# Patient Record
Sex: Male | Born: 1955 | Race: White | Hispanic: No | Marital: Married | Smoking: Never smoker
Health system: Southern US, Community
[De-identification: ages and names within clinical notes are randomized; demographics above are authoritative.]

## PROBLEM LIST (undated history)

## (undated) DIAGNOSIS — I10 Essential (primary) hypertension: Secondary | ICD-10-CM

## (undated) DIAGNOSIS — F32A Depression, unspecified: Secondary | ICD-10-CM

## (undated) DIAGNOSIS — J189 Pneumonia, unspecified organism: Secondary | ICD-10-CM

## (undated) DIAGNOSIS — M1990 Unspecified osteoarthritis, unspecified site: Secondary | ICD-10-CM

## (undated) DIAGNOSIS — F419 Anxiety disorder, unspecified: Secondary | ICD-10-CM

## (undated) DIAGNOSIS — N189 Chronic kidney disease, unspecified: Secondary | ICD-10-CM

## (undated) DIAGNOSIS — F329 Major depressive disorder, single episode, unspecified: Secondary | ICD-10-CM

## (undated) DIAGNOSIS — F99 Mental disorder, not otherwise specified: Secondary | ICD-10-CM

## (undated) HISTORY — PX: KNEE ARTHROSCOPY: SUR90

## (undated) HISTORY — PX: WRIST ARTHROSCOPY: SUR100

## (undated) HISTORY — PX: JOINT REPLACEMENT: SHX530

## (undated) HISTORY — PX: OTHER SURGICAL HISTORY: SHX169

## (undated) HISTORY — PX: SHOULDER ARTHROSCOPY DISTAL CLAVICLE EXCISION AND OPEN ROTATOR CUFF REPAIR: SHX2396

---

## 2005-07-07 ENCOUNTER — Ambulatory Visit: Payer: Self-pay | Admitting: Internal Medicine

## 2005-08-01 ENCOUNTER — Emergency Department: Payer: Self-pay | Admitting: Emergency Medicine

## 2005-08-01 ENCOUNTER — Other Ambulatory Visit: Payer: Self-pay

## 2007-07-04 ENCOUNTER — Ambulatory Visit: Payer: Self-pay | Admitting: General Practice

## 2008-09-22 ENCOUNTER — Emergency Department: Payer: Self-pay | Admitting: Emergency Medicine

## 2010-08-15 ENCOUNTER — Ambulatory Visit: Payer: Self-pay | Admitting: Unknown Physician Specialty

## 2011-01-13 ENCOUNTER — Ambulatory Visit: Payer: Self-pay | Admitting: Anesthesiology

## 2011-01-18 ENCOUNTER — Ambulatory Visit: Payer: Self-pay | Admitting: Unknown Physician Specialty

## 2011-06-29 ENCOUNTER — Other Ambulatory Visit: Payer: Self-pay | Admitting: Urology

## 2011-07-05 ENCOUNTER — Encounter (HOSPITAL_COMMUNITY): Payer: Self-pay | Admitting: *Deleted

## 2011-07-05 NOTE — Progress Notes (Signed)
Patient reminded not to take any Aspirin products or vitamins 3 days prior to ESWL. He said he has stopped taking all of his medicationa

## 2011-07-07 ENCOUNTER — Encounter (HOSPITAL_COMMUNITY): Payer: Self-pay | Admitting: Pharmacy Technician

## 2011-07-07 NOTE — H&P (Signed)
History of Present Illness  Steven Madden is a 56 year old with the following urologic history:  1) Hematuria: He developed painless gross hematuria in May 2013. He has no family history of bladder or kidney cancer. He did smoke 1 PPD for 2 years but quit around 1980. He underwent an evaluation including cystoscopy and CT imaging in May 2013 which indicated a ureteral stone and no findings to suggest concern for malignancy.  2) Urolithiasis: He has a history of recurrent urolithiasis.  2011: Passed a stone  3) Prostate cancer screening: He has a family history of prostate cancer.  His father was diagnosed at age 47. He receives PSA screening by Dr. Hyacinth Meeker.  Interval history:  He follows up today for further evaluation is 8 mm proximal right ureteral calculus. He has not noted that he passed any stone fragments as his last visit. He did have gross hematuria and mild right-sided flank pain over the weekend although has not had uncontrolled pain, fever, or associated nausea or vomiting. He follows up today with a followup KUB x-ray.   Past Medical History Problems  1. History of  Anxiety (Symptom) 300.00 2. History of  Arthritis V13.4 3. History of  Hypercholesterolemia 272.0  Surgical History Problems  1. History of  Arthroscopy Knee Left 2. History of  Arthroscopy Knee Right 3. History of  Shoulder Surgery Right 4. History of  Wrist Surgery Right  Current Meds 1. Lovastatin 10 MG Oral Tablet; Therapy: (Recorded:17May2013) to 2. Tamsulosin HCl 0.4 MG Oral Capsule; TAKE 1 CAPSULE Daily; Therapy: 21May2013 to  (Evaluate:04Jun2013)  Requested for: 21May2013; Last Rx:21May2013 3. Xanax 0.5 MG Oral Tablet; Therapy: (Recorded:17May2013) to 4. Zoloft 50 MG Oral Tablet; Therapy: (Recorded:17May2013) to  Allergies Medication  1. Penicillins Non-Medication  2. Latex  Family History Problems  1. Family history of  Acute Myocardial Infarction V17.3 2. Family history of  Hypertension  V17.49 3. Family history of  Malignant Melanoma Of The Skin V16.8 4. Fraternal history of  Nephrolithiasis 5. Paternal history of  Prostate Cancer V16.42  Social History Problems  1. Former Smoker V15.82 1 PPD for 2 years and quit in 1980 2. Marital History - Currently Married Denied  3. History of  Alcohol Use  Vitals Vital Signs [Data Includes: Last 1 Day]  03Jun2013 03:25PM  BMI Calculated: 29.44 BSA Calculated: 2.19 Height: 5 ft 11.5 in Weight: 215 lb  Blood Pressure: 161 / 91 Heart Rate: 85  Physical Exam Constitutional: Well nourished and well developed . No acute distress.  ENT:. The ears and nose are normal in appearance.  Neck: The appearance of the neck is normal and no neck mass is present.  Pulmonary: No respiratory distress and normal respiratory rhythm and effort.  Cardiovascular: Heart rate and rhythm are normal . No peripheral edema.  Abdomen: No CVA tenderness.    Results/Data Urine [Data Includes: Last 1 Day]   03Jun2013  COLOR YELLOW   APPEARANCE CLEAR   SPECIFIC GRAVITY >1.030   pH 6.0   GLUCOSE NEG mg/dL  BILIRUBIN NEG   KETONE NEG mg/dL  BLOOD LARGE   PROTEIN NEG mg/dL  UROBILINOGEN 0.2 mg/dL  NITRITE NEG   LEUKOCYTE ESTERASE NEG   SQUAMOUS EPITHELIAL/HPF RARE   WBC 4-6 WBC/hpf  RBC 7-10 RBC/hpf  BACTERIA NONE SEEN   CRYSTALS NONE SEEN   CASTS NONE SEEN     I independently reviewed his KUB. This demonstrates a stable calcification in the vicinity of the right proximal ureter consistent with  his known stone. It appears unchanged from his prior KUB x-ray.   Assessment Assessed  1. Ureteral Stone 592.1  Plan Health Maintenance (V70.0)  1. UA With REFLEX  Done: 03Jun2013 02:50PM  Discussion/Summary  1. Right proximal ureteral calculus. We reviewed treatment options for his which would include shock wave lithotripsy or ureteroscopic laser lithotripsy. We also discussed the alternative option of a percutaneous nephrolithotomy which I did  not recommend. After reviewing the potential pros and cons of each approach, he would like to proceed with shockwave lithotripsy. We have reviewed the potential risks and complications as well as the expected recovery process in detail. He also understands the possibility of bleeding, infection, damage/loss of kidney, or failure of the procedure and possible need for further procedures, et Karie Soda. This will be scheduled for the near future.  2. Recurrent urolithiasis: I will recommend he proceed with a metabolic evaluation following treatment of his acute stone.  Cc: Dr. Bethann Punches     Signatures Electronically signed by : Heloise Purpura, M.D.; Jun 28 2011  4:05PM

## 2011-07-08 ENCOUNTER — Ambulatory Visit (HOSPITAL_COMMUNITY): Payer: BC Managed Care – PPO

## 2011-07-08 ENCOUNTER — Ambulatory Visit (HOSPITAL_COMMUNITY)
Admission: RE | Admit: 2011-07-08 | Discharge: 2011-07-08 | Disposition: A | Discharge status: Home / Self Care | Payer: BC Managed Care – PPO | Source: Ambulatory Visit | Attending: Urology | Admitting: Urology

## 2011-07-08 ENCOUNTER — Encounter (HOSPITAL_COMMUNITY): Payer: Self-pay | Admitting: *Deleted

## 2011-07-08 ENCOUNTER — Encounter (HOSPITAL_COMMUNITY)
Admission: RE | Disposition: A | Discharge status: Home / Self Care | Payer: Self-pay | Source: Ambulatory Visit | Attending: Urology

## 2011-07-08 DIAGNOSIS — Z79899 Other long term (current) drug therapy: Secondary | ICD-10-CM | POA: Insufficient documentation

## 2011-07-08 DIAGNOSIS — N201 Calculus of ureter: Secondary | ICD-10-CM | POA: Insufficient documentation

## 2011-07-08 DIAGNOSIS — E7800 Pure hypercholesterolemia, unspecified: Secondary | ICD-10-CM | POA: Insufficient documentation

## 2011-07-08 DIAGNOSIS — M542 Cervicalgia: Secondary | ICD-10-CM | POA: Insufficient documentation

## 2011-07-08 DIAGNOSIS — F411 Generalized anxiety disorder: Secondary | ICD-10-CM | POA: Insufficient documentation

## 2011-07-08 HISTORY — DX: Pneumonia, unspecified organism: J18.9

## 2011-07-08 HISTORY — DX: Anxiety disorder, unspecified: F41.9

## 2011-07-08 HISTORY — DX: Chronic kidney disease, unspecified: N18.9

## 2011-07-08 HISTORY — DX: Unspecified osteoarthritis, unspecified site: M19.90

## 2011-07-08 HISTORY — DX: Mental disorder, not otherwise specified: F99

## 2011-07-08 SURGERY — LITHOTRIPSY, ESWL
Anesthesia: LOCAL | Laterality: Right

## 2011-07-08 MED ORDER — DIAZEPAM 5 MG PO TABS
10.0000 mg | ORAL_TABLET | ORAL | Status: AC
  Administered 2011-07-08: 10 mg via ORAL

## 2011-07-08 MED ORDER — DIAZEPAM 5 MG PO TABS
ORAL_TABLET | ORAL | Status: AC
  Filled 2011-07-08: qty 2

## 2011-07-08 MED ORDER — DIPHENHYDRAMINE HCL 25 MG PO CAPS
25.0000 mg | ORAL_CAPSULE | ORAL | Status: AC
  Administered 2011-07-08: 25 mg via ORAL

## 2011-07-08 MED ORDER — CIPROFLOXACIN HCL 500 MG PO TABS: 500.0000 mg | ORAL_TABLET | ORAL | Status: DC

## 2011-07-08 MED ORDER — DEXTROSE-NACL 5-0.45 % IV SOLN
INTRAVENOUS | Status: DC
  Administered 2011-07-08: 15:00:00 via INTRAVENOUS

## 2011-07-08 MED ORDER — CIPROFLOXACIN HCL 500 MG PO TABS
ORAL_TABLET | ORAL | Status: AC
  Administered 2011-07-08: 500 mg
  Filled 2011-07-08: qty 1

## 2011-07-08 MED ORDER — DIPHENHYDRAMINE HCL 25 MG PO CAPS
ORAL_CAPSULE | ORAL | Status: AC
  Filled 2011-07-08: qty 1

## 2011-07-08 NOTE — Op Note (Signed)
See op note in paper chart.

## 2011-07-08 NOTE — Progress Notes (Signed)
Patient and wife given discharge instructions, teach back method

## 2011-07-08 NOTE — Discharge Instructions (Signed)
Call if you develop fever > 101, uncontrolled pain, or persistent nausea or vomiting.  Strain urine as instructed and bring stone fragments to your followup appointment.  Take pain medication as needed for pain.  You can expect to have pain in your abdomen or back to some degree for a few days and sometimes longer.  However, if pain is severe and not controlled with your pain medication, you should call your doctor.

## 2011-07-20 ENCOUNTER — Other Ambulatory Visit: Payer: Self-pay | Admitting: Urology

## 2011-07-20 ENCOUNTER — Encounter (HOSPITAL_BASED_OUTPATIENT_CLINIC_OR_DEPARTMENT_OTHER): Payer: Self-pay | Admitting: *Deleted

## 2011-07-20 ENCOUNTER — Ambulatory Visit (HOSPITAL_BASED_OUTPATIENT_CLINIC_OR_DEPARTMENT_OTHER)
Admission: RE | Admit: 2011-07-20 | Discharge: 2011-07-20 | Disposition: A | Discharge status: Home / Self Care | Payer: BC Managed Care – PPO | Source: Ambulatory Visit | Attending: Urology | Admitting: Urology

## 2011-07-20 ENCOUNTER — Encounter (HOSPITAL_BASED_OUTPATIENT_CLINIC_OR_DEPARTMENT_OTHER): Payer: Self-pay | Admitting: Anesthesiology

## 2011-07-20 ENCOUNTER — Encounter (HOSPITAL_BASED_OUTPATIENT_CLINIC_OR_DEPARTMENT_OTHER)
Admission: RE | Disposition: A | Discharge status: Home / Self Care | Payer: Self-pay | Source: Ambulatory Visit | Attending: Urology

## 2011-07-20 ENCOUNTER — Ambulatory Visit (HOSPITAL_BASED_OUTPATIENT_CLINIC_OR_DEPARTMENT_OTHER): Payer: BC Managed Care – PPO | Admitting: Anesthesiology

## 2011-07-20 DIAGNOSIS — N1330 Unspecified hydronephrosis: Secondary | ICD-10-CM | POA: Insufficient documentation

## 2011-07-20 DIAGNOSIS — N201 Calculus of ureter: Secondary | ICD-10-CM | POA: Insufficient documentation

## 2011-07-20 DIAGNOSIS — E7800 Pure hypercholesterolemia, unspecified: Secondary | ICD-10-CM | POA: Insufficient documentation

## 2011-07-20 DIAGNOSIS — Z79899 Other long term (current) drug therapy: Secondary | ICD-10-CM | POA: Insufficient documentation

## 2011-07-20 HISTORY — PX: CYSTOSCOPY W/ URETERAL STENT PLACEMENT: SHX1429

## 2011-07-20 LAB — POCT I-STAT 4, (NA,K, GLUC, HGB,HCT)
Hemoglobin: 15.6 g/dL (ref 13.0–17.0)
Potassium: 3.9 mEq/L (ref 3.5–5.1)

## 2011-07-20 SURGERY — CYSTOSCOPY, WITH RETROGRADE PYELOGRAM AND URETERAL STENT INSERTION
Anesthesia: General | Site: Ureter | Laterality: Right | Wound class: Clean Contaminated

## 2011-07-20 MED ORDER — PROMETHAZINE HCL 25 MG/ML IJ SOLN: 6.2500 mg | INTRAMUSCULAR | Status: DC | PRN

## 2011-07-20 MED ORDER — FENTANYL CITRATE 0.05 MG/ML IJ SOLN: 25.0000 ug | INTRAMUSCULAR | Status: DC | PRN

## 2011-07-20 MED ORDER — URELLE 81 MG PO TABS: 1.0000 | ORAL_TABLET | Freq: Three times a day (TID) | ORAL | Status: DC

## 2011-07-20 MED ORDER — ACETAMINOPHEN 650 MG RE SUPP: 650.0000 mg | RECTAL | Status: DC | PRN

## 2011-07-20 MED ORDER — HYDROCODONE-ACETAMINOPHEN 5-325 MG PO TABS
1.0000 | ORAL_TABLET | Freq: Four times a day (QID) | ORAL | Status: AC | PRN
  Administered 2011-07-20: 1 via ORAL

## 2011-07-20 MED ORDER — GLYCOPYRROLATE 0.2 MG/ML IJ SOLN
INTRAMUSCULAR | Status: DC | PRN
  Administered 2011-07-20: 0.2 mg via INTRAVENOUS

## 2011-07-20 MED ORDER — SODIUM CHLORIDE 0.9 % IV SOLN: 250.0000 mL | INTRAVENOUS | Status: DC | PRN

## 2011-07-20 MED ORDER — ACETAMINOPHEN 325 MG PO TABS: 650.0000 mg | ORAL_TABLET | ORAL | Status: DC | PRN

## 2011-07-20 MED ORDER — IOHEXOL 350 MG/ML SOLN
INTRAVENOUS | Status: DC | PRN
  Administered 2011-07-20: 50 mL via INTRAVENOUS

## 2011-07-20 MED ORDER — ONDANSETRON HCL 4 MG/2ML IJ SOLN: 4.0000 mg | Freq: Four times a day (QID) | INTRAMUSCULAR | Status: DC | PRN

## 2011-07-20 MED ORDER — DEXAMETHASONE SODIUM PHOSPHATE 4 MG/ML IJ SOLN
INTRAMUSCULAR | Status: DC | PRN
  Administered 2011-07-20 (×2): 5 mg via INTRAVENOUS

## 2011-07-20 MED ORDER — KETOROLAC TROMETHAMINE 30 MG/ML IJ SOLN
INTRAMUSCULAR | Status: DC | PRN
  Administered 2011-07-20: 30 mg via INTRAVENOUS

## 2011-07-20 MED ORDER — LIDOCAINE HCL (CARDIAC) 20 MG/ML IV SOLN
INTRAVENOUS | Status: DC | PRN
  Administered 2011-07-20: 80 mg via INTRAVENOUS

## 2011-07-20 MED ORDER — SODIUM CHLORIDE 0.9 % IJ SOLN: 3.0000 mL | Freq: Two times a day (BID) | INTRAMUSCULAR | Status: DC

## 2011-07-20 MED ORDER — MEPERIDINE HCL 25 MG/ML IJ SOLN: 6.2500 mg | INTRAMUSCULAR | Status: DC | PRN

## 2011-07-20 MED ORDER — ACETAMINOPHEN 10 MG/ML IV SOLN
INTRAVENOUS | Status: DC | PRN
  Administered 2011-07-20: 1000 mg via INTRAVENOUS

## 2011-07-20 MED ORDER — OXYCODONE HCL 5 MG PO TABS: 5.0000 mg | ORAL_TABLET | ORAL | Status: DC | PRN

## 2011-07-20 MED ORDER — MIDAZOLAM HCL 5 MG/5ML IJ SOLN
INTRAMUSCULAR | Status: DC | PRN
  Administered 2011-07-20: 2 mg via INTRAVENOUS

## 2011-07-20 MED ORDER — CIPROFLOXACIN IN D5W 400 MG/200ML IV SOLN: 400.0000 mg | INTRAVENOUS | Status: DC

## 2011-07-20 MED ORDER — CIPROFLOXACIN IN D5W 400 MG/200ML IV SOLN
400.0000 mg | Freq: Two times a day (BID) | INTRAVENOUS | Status: DC
  Administered 2011-07-20: 400 mg via INTRAVENOUS

## 2011-07-20 MED ORDER — SODIUM CHLORIDE 0.9 % IJ SOLN: 3.0000 mL | INTRAMUSCULAR | Status: DC | PRN

## 2011-07-20 MED ORDER — INDIGOTINDISULFONATE SODIUM 8 MG/ML IJ SOLN
INTRAMUSCULAR | Status: DC | PRN
  Administered 2011-07-20: 5 mL via INTRAVENOUS

## 2011-07-20 MED ORDER — SODIUM CHLORIDE 0.9 % IR SOLN
Status: DC | PRN
  Administered 2011-07-20: 3000 mL

## 2011-07-20 MED ORDER — OXYBUTYNIN CHLORIDE ER 5 MG PO TB24: 5.0000 mg | ORAL_TABLET | Freq: Every day | ORAL | Status: DC

## 2011-07-20 MED ORDER — PROPOFOL 10 MG/ML IV EMUL
INTRAVENOUS | Status: DC | PRN
  Administered 2011-07-20: 250 mg via INTRAVENOUS

## 2011-07-20 MED ORDER — OXYBUTYNIN CHLORIDE 5 MG PO TABS
5.0000 mg | ORAL_TABLET | Freq: Three times a day (TID) | ORAL | Status: AC
  Administered 2011-07-20: 5 mg via ORAL

## 2011-07-20 MED ORDER — FENTANYL CITRATE 0.05 MG/ML IJ SOLN
INTRAMUSCULAR | Status: DC | PRN
  Administered 2011-07-20 (×3): 50 ug via INTRAVENOUS

## 2011-07-20 MED ORDER — SODIUM CHLORIDE 0.45 % IV SOLN: INTRAVENOUS | Status: DC

## 2011-07-20 MED ORDER — LACTATED RINGERS IV SOLN
INTRAVENOUS | Status: DC
  Administered 2011-07-20 (×2): via INTRAVENOUS

## 2011-07-20 MED ORDER — HYDROCODONE-ACETAMINOPHEN 7.5-650 MG PO TABS: 1.0000 | ORAL_TABLET | Freq: Four times a day (QID) | ORAL | Status: AC | PRN

## 2011-07-20 MED ORDER — LACTATED RINGERS IV SOLN: INTRAVENOUS | Status: DC

## 2011-07-20 MED ORDER — ONDANSETRON HCL 4 MG/2ML IJ SOLN
INTRAMUSCULAR | Status: DC | PRN
  Administered 2011-07-20: 4 mg via INTRAVENOUS

## 2011-07-20 SURGICAL SUPPLY — 17 items
ADAPTER CATH URET PLST 4-6FR (CATHETERS) ×3 IMPLANT
BAG DRAIN URO-CYSTO SKYTR STRL (DRAIN) ×3 IMPLANT
BOOTIES KNEE HIGH SLOAN (MISCELLANEOUS) ×3 IMPLANT
CANISTER SUCT LVC 12 LTR MEDI- (MISCELLANEOUS) ×3 IMPLANT
CATH INTERMIT  6FR 70CM (CATHETERS) IMPLANT
CLOTH BEACON ORANGE TIMEOUT ST (SAFETY) ×3 IMPLANT
DRAPE CAMERA CLOSED 9X96 (DRAPES) ×3 IMPLANT
GLOVE BIO SURGEON STRL SZ7.5 (GLOVE) ×3 IMPLANT
GOWN PREVENTION PLUS XLARGE (GOWN DISPOSABLE) ×3 IMPLANT
GOWN STRL NON-REIN LRG LVL3 (GOWN DISPOSABLE) ×3 IMPLANT
GOWN STRL REIN XL XLG (GOWN DISPOSABLE) ×3 IMPLANT
GOWN XL W/COTTON TOWEL STD (GOWNS) ×3 IMPLANT
GUIDEWIRE 0.038 PTFE COATED (WIRE) IMPLANT
GUIDEWIRE ANG ZIPWIRE 038X150 (WIRE) IMPLANT
GUIDEWIRE STR DUAL SENSOR (WIRE) ×3 IMPLANT
NS IRRIG 500ML POUR BTL (IV SOLUTION) IMPLANT
PACK CYSTOSCOPY (CUSTOM PROCEDURE TRAY) ×3 IMPLANT

## 2011-07-20 NOTE — H&P (Signed)
Chief Complaint  Seen today for complaint of continual pain post ESWL.   Active Problems Problems  1. Hydronephrosis On The Right 591 2. Ureteral Stone 592.1  History of Present Illness        56 YO male patient of Dr. Vevelyn Royals seen today for complaint of continual pain post (R) gated ESWL 07/08/11.  Interval HX:  Today states that post procedure he was pain free up until 4 days ago. Since Friday 07/16/11 has had acute right flank pain that has required Norco for pain control with pain last pm severe. Has passed 1 "tiny" fragment. Denies hematuria or constipation. No solid foods since last pm @1800  and had 12 oz of water this am @ 0700.   Past Medical History Problems  1. History of  Anxiety (Symptom) 300.00 2. History of  Arthritis V13.4 3. History of  Hypercholesterolemia 272.0  Surgical History Problems  1. History of  Arthroscopy Knee Left 2. History of  Arthroscopy Knee Right 3. History of  Lithotripsy 4. History of  Shoulder Surgery Right 5. History of  Wrist Surgery Right  Current Meds 1. Lovastatin 10 MG Oral Tablet; Therapy: (Recorded:17May2013) to 2. Tamsulosin HCl 0.4 MG Oral Capsule; TAKE 1 CAPSULE Daily; Therapy: 21May2013 to  (Evaluate:04Jun2013)  Requested for: 21May2013; Last Rx:21May2013 3. Xanax 0.5 MG Oral Tablet; Therapy: (Recorded:17May2013) to 4. Zoloft 50 MG Oral Tablet; Therapy: (Recorded:17May2013) to  Allergies Medication  1. Penicillins Non-Medication  2. Latex  Family History Problems  1. Family history of  Acute Myocardial Infarction V17.3 2. Family history of  Hypertension V17.49 3. Family history of  Malignant Melanoma Of The Skin V16.8 4. Fraternal history of  Nephrolithiasis 5. Paternal history of  Prostate Cancer V16.42  Social History Problems  1. Former Smoker V15.82 1 PPD for 2 years and quit in 1980 2. Marital History - Currently Married Denied  3. History of  Alcohol Use  Review of Systems Genitourinary, constitutional,  skin, eye, otolaryngeal, hematologic/lymphatic, cardiovascular, pulmonary, endocrine, musculoskeletal, gastrointestinal, neurological and psychiatric system(s) were reviewed and pertinent findings if present are noted.  Gastrointestinal: flank pain and abdominal pain.    Vitals Vital Signs [Data Includes: Last 1 Day]  25Jun2013 08:11AM  Blood Pressure: 138 / 79 Temperature: 97.7 F Heart Rate: 57  Physical Exam Constitutional: Well nourished and well developed . No acute distress. The patient appears well hydrated.  ENT:. The ears and nose are normal in appearance.  Neck: The appearance of the neck is normal.  Pulmonary: No respiratory distress.  Cardiovascular: Heart rate and rhythm are normal.  Abdomen: The abdomen is flat. The abdomen is soft and nontender. Mild tenderness in the RLQ is present. mild right CVA tenderness.  Skin: Normal skin turgor and normal skin color and pigmentation.  Neuro/Psych:. Mood and affect are appropriate.    Results/Data Urine [Data Includes: Last 1 Day]   25Jun2013  COLOR YELLOW   APPEARANCE CLEAR   SPECIFIC GRAVITY 1.010   pH 6.0   GLUCOSE NEG mg/dL  BILIRUBIN NEG   KETONE NEG mg/dL  BLOOD MOD   PROTEIN NEG mg/dL  UROBILINOGEN 0.2 mg/dL  NITRITE NEG   LEUKOCYTE ESTERASE NEG   SQUAMOUS EPITHELIAL/HPF RARE   WBC 0-3 WBC/hpf  RBC 0-3 RBC/hpf  BACTERIA NONE SEEN   CRYSTALS NONE SEEN   CASTS NONE SEEN    The following images/tracing/specimen were independently visualized:  CTU: mild/moderate right hydronephrosis secondary to 4 mm right upper ureteral caclulus. No other calcification noted along ureter.  The following clinical lab reports were reviewed:  UA. Selected Results  AU CT-STONE PROTOCOL 25Jun2013 12:00AM Jetta Lout   Test Name Result Flag Reference  ** RADIOLOGY REPORT BY [facility] ** ORIGINAL APPROVED BY: Rosealee Albee, M.D. ON: 07/20/2011 10:12:29   *RADIOLOGY REPORT*  Clinical Data: Microhematuria  CT  ABDOMEN AND PELVIS WITHOUT CONTRAST  Technique: Multidetector CT imaging of the abdomen and pelvis was performed following the standard protocol without intravenous contrast.  Comparison: None  Findings: Scar like density noted in the left base. No pericardial or pleural effusion.  There is no focal liver abnormality. The gallbladder appears normal. No biliary dilatation. The pancreas appears within normal limits. Normal appearance of the spleen. The stomach and the small bowel loops appear within normal limits. The appendix is identified and appears normal. Scattered colonic diverticula identified without acute inflammation.  Both of the adrenal glands are normal. The left kidney appears normal. There is right-sided hydronephrosis. At the right UPJ there is a 5 mm stone, image number 33. There is a 4 mm stone within the lower pole of the right kidney, image number 30. The urinary bladder appears within normal limits.  No upper abdominal or pelvic adenopathy. No inguinal adenopathy. No free fluid or fluid collections within the abdomen or pelvis. Review of the visualized osseous structures is significant for lumbar spondylosis.  IMPRESSION:  1. Right-sided nephrolithiasis. 2. Right-sided hydronephrosis secondary to 5 mm right UPJ calculus.   Assessment Assessed  1. Ureteral Stone 592.1 2. Hydronephrosis On The Right 591  Plan Health Maintenance (V70.0)  1. UA With REFLEX  Done: 25Jun2013 08:00AM Ureteral Stone (592.1)  2. Ketorolac Tromethamine 60 MG/2ML Intramuscular Solution; INJECT 60  MG Intramuscular;  Done: 25Jun2013 08:33AM; Status: COMPLETE 3. AU CT-STONE PROTOCOL  Done: 25Jun2013 12:00AM      1. Ketorolac 60 mg IM. Moderate relief 2. Scheduled cystoureterscopy, and placeement of double J stent today by Dr. Patsi Sears. Risks and benefits discussed which include: unable to retrieve stone, injury to ureter, bleeding, and risk of infection. Will f/u po as  scheduled with Dr. Laverle Patter 07/06/11.   Signatures Electronically signed by : Jetta Lout, Dyann Ruddle; Jul 20 2011 10:37AM

## 2011-07-20 NOTE — Anesthesia Preprocedure Evaluation (Signed)
Anesthesia Evaluation  Patient identified by MRN, date of birth, ID band Patient awake    Reviewed: Allergy & Precautions, H&P , NPO status , Patient's Chart, lab work & pertinent test results  Airway Mallampati: II TM Distance: >3 FB Neck ROM: Full    Dental No notable dental hx.    Pulmonary neg pulmonary ROS,  breath sounds clear to auscultation  Pulmonary exam normal       Cardiovascular negative cardio ROS  Rhythm:Regular Rate:Normal     Neuro/Psych PSYCHIATRIC DISORDERS Anxiety negative neurological ROS  negative psych ROS   GI/Hepatic negative GI ROS, Neg liver ROS,   Endo/Other  negative endocrine ROS  Renal/GU negative Renal ROS  negative genitourinary   Musculoskeletal negative musculoskeletal ROS (+)   Abdominal   Peds negative pediatric ROS (+)  Hematology negative hematology ROS (+)   Anesthesia Other Findings   Reproductive/Obstetrics negative OB ROS                           Anesthesia Physical Anesthesia Plan  ASA: II  Anesthesia Plan: General   Post-op Pain Management:    Induction: Intravenous  Airway Management Planned:   Additional Equipment:   Intra-op Plan:   Post-operative Plan: Extubation in OR  Informed Consent: I have reviewed the patients History and Physical, chart, labs and discussed the procedure including the risks, benefits and alternatives for the proposed anesthesia with the patient or authorized representative who has indicated his/her understanding and acceptance.   Dental advisory given  Plan Discussed with: CRNA  Anesthesia Plan Comments:         Anesthesia Quick Evaluation

## 2011-07-20 NOTE — Anesthesia Procedure Notes (Signed)
Procedure Name: LMA Insertion Date/Time: 07/20/2011 1:06 PM Performed by: Norva Pavlov Pre-anesthesia Checklist: Patient identified, Emergency Drugs available, Suction available and Patient being monitored Patient Re-evaluated:Patient Re-evaluated prior to inductionOxygen Delivery Method: Circle System Utilized Preoxygenation: Pre-oxygenation with 100% oxygen Intubation Type: IV induction Ventilation: Mask ventilation without difficulty LMA: LMA inserted LMA Size: 4.0 Number of attempts: 1 Airway Equipment and Method: bite block Placement Confirmation: positive ETCO2 Tube secured with: Tape Dental Injury: Teeth and Oropharynx as per pre-operative assessment

## 2011-07-20 NOTE — Op Note (Signed)
Pre-operative diagnosis : R UPJ stone  Postoperative diagnosis:same  Operation:cysto, Right retrograde pyelogram with interpretation, Right JJ stent placement ( 69F x 26cm)  Surgeon:  S. Patsi Sears, MD  First assistant:none  Anesthesia:  Gen LMA  Preparation: After appropriate preanesthesia, the patient was brought to the operating room, and placed on the operating table in the dorsal supine position where general LMA anesthesia was introduced. The right arm was previously marked. Name tag was double checked. The patient was replaced in the dorsal lithotomy position, where the pubis was prepped, it and draped in the usual fashion. The patient was given IV Tylenol before anesthesia was given, and a B. and O. suppository was given at anesthetic induction.  Review history: The patient is a 56 year old male, status post lithotripsy of an 8 mm right UP junction stone per Dr. Laverle Patter. The stone ha had limited fragmentation, and has not passed, and the patient has continued renal colic. He is now for right double-J stent placement.  Statement of  Likelihood of Success: Excellent. TIME-OUT observed.:  Procedure: Cystourethroscopy was accomplished, and shows normal urethra without stricture. The prostate is bilobar, and nonobstructed. The bladder base is normal. Although and able to identify the orifice the left side, I am unable to identify the orifice the right side, despite using both a 30 lens, and a 70 lens. Therefore, I've instructed anesthesia to give the patient 1 cc of indigo carmine. After approximately 10 minutes, I am able to identify blue contrast in the left ureter, and after approximately 15 minutes, I am able to identify blue contrast in the right ureter. I then able to pass a 6 [place] open-ended catheter into the obstructed right ureter. Right retrograde pyelogram is then performed, which shows normal caliber ureter. Larina Bras is identified it UPJ, and is floated into the right renal pelvis. A  guidewire is passed into the right renal pelvis, and a 6 [place] by 26 cm stent is coiled in the renal pelvis, and the bladder. The patient received IV Toradol, was awakened, taken recovery room in good condition.

## 2011-07-20 NOTE — Interval H&P Note (Signed)
History and Physical Interval Note:  07/20/2011 12:55 PM  Steven Madden  has presented today for surgery, with the diagnosis of RIGHT HYDRO AND RIGHT UPJ STONE  The various methods of treatment have been discussed with the patient and family. After consideration of risks, benefits and other options for treatment, the patient has consented to  Procedure(s) (LRB): CYSTOSCOPY WITH RETROGRADE PYELOGRAM/URETERAL STENT PLACEMENT (Right) as a surgical intervention .  The patient's history has been reviewed, patient examined, no change in status, stable for surgery.  I have reviewed the patients' chart and labs.  Questions were answered to the patient's satisfaction.     [name]

## 2011-07-20 NOTE — Transfer of Care (Signed)
Immediate Anesthesia Transfer of Care Note  Patient: Steven Madden  Procedure(s) Performed: Procedure(s) (LRB): CYSTOSCOPY WITH RETROGRADE PYELOGRAM/URETERAL STENT PLACEMENT (Right)  Patient Location: PACU  Anesthesia Type: General  Level of Consciousness: awake, alert  and oriented  Airway & Oxygen Therapy: Patient Spontanous Breathing and Patient connected to face mask oxygen  Post-op Assessment: Report given to PACU RN and Post -op Vital signs reviewed and stable  Post vital signs: Reviewed and stable  Complications: No apparent anesthesia complications

## 2011-07-20 NOTE — Discharge Instructions (Addendum)
Ureteral Colic (Kidney Stones) Ureteral colic is the result of a condition when kidney stones form inside the kidney. Once kidney stones are formed they may move into the tube that connects the kidney with the bladder (ureter). If this occurs, this condition may cause pain (colic) in the ureter.  CAUSES  Pain is caused by stone movement in the ureter and the obstruction caused by the stone. SYMPTOMS  The pain comes and goes as the ureter contracts around the stone. The pain is usually intense, sharp, and stabbing in character. The location of the pain may move as the stone moves through the ureter. When the stone is near the kidney the pain is usually located in the back and radiates to the belly (abdomen). When the stone is ready to pass into the bladder the pain is often located in the lower abdomen on the side the stone is located. At this location, the symptoms may mimic those of a urinary tract infection with urinary frequency. Once the stone is located here it often passes into the bladder and the pain disappears completely. TREATMENT   Your caregiver will provide you with medicine for pain relief.   You may require specialized follow-up X-rays.   The absence of pain does not always mean that the stone has passed. It may have just stopped moving. If the urine remains completely obstructed, it can cause loss of kidney function or even complete destruction of the involved kidney. It is your responsibility and in your interest that X-rays and follow-ups as suggested by your caregiver are completed. Relief of pain without passage of the stone can be associated with severe damage to the kidney, including loss of kidney function on that side.   If your stone does not pass on its own, additional measures may be taken by your caregiver to ensure its removal.  HOME CARE INSTRUCTIONS   Increase your fluid intake. Water is the preferred fluid since juices containing vitamin C may acidify the urine making  it less likely for certain stones (uric acid stones) to pass.   Strain all urine. A strainer will be provided. Keep all particulate matter or stones for your caregiver to inspect.   Take your pain medicine as directed.   Make a follow-up appointment with your caregiver as directed.   Remember that the goal is passage of your stone. The absence of pain does not mean the stone is gone. Follow your caregiver's instructions.   Only take over-the-counter or prescription medicines for pain, discomfort, or fever as directed by your caregiver.  SEEK MEDICAL CARE IF:   Pain cannot be controlled with the prescribed medicine.   You have a fever.   Pain continues for longer than your caregiver advises it should.   There is a change in the pain, and you develop chest discomfort or constant abdominal pain.   You feel faint or pass out.  MAKE SURE YOU:   Understand these instructions.   Will watch your condition.   Will get help right away if you are not doing well or get worse.  Document Released: 10/21/2004 Document Revised: 12/31/2010 Document Reviewed: 07/08/2010 [facility] Patient Information 2012 [place], [place].Oxalate Restricted Diet A low-oxalate diet consists of foods that are low in a naturally occurring compound called oxalate that is found in plants.  INDICATIONS FOR USE Your caregiver may ask you to follow a low-oxalate diet in order to reduce certain types of kidney stones.  GUIDELINES  Avoid high-oxalate foods listed below:  Grains:  High-fiber or bran cereal, whole-wheat bread, grits, barley, buckwheat, graham crackers, amaranth, pretzels, and fruitcake.   Vegetables: Dried beans, wax beans, [place] fries, sweet potatoes, chives, dark leafy greens, eggplant, leaks, okra, parsley, rutabaga, tomato paste, watercress, and escarole.   Fruit: Dried apricots, red currants, figs, kiwi, plums, and rhubarb.   Meat and Meat Substitutes: All nuts and nut butters, sesame seeds, and  tahini paste. Soybeans and foods made from soy (soyburger, miso).   Milk: Chocolate milk and soymilk.   Fats and Oils: None to avoid.   Condiments/Miscellaneous: Chocolate, carob, marmalade, poppy seeds, instant iced tea, and juice from high-oxalate fruits.  Document Released: 05/08/2010 Document Revised: 12/31/2010 Document Reviewed: 05/08/2010 [facility] Patient Information 2012 [place], [place].Kidney Stones Kidney stones (ureteral lithiasis) are solid masses that form inside your kidneys. The intense pain is caused by the stone moving through the kidney, ureter, bladder, and urethra (urinary tract). When the stone moves, the ureter starts to spasm around the stone. The stone is usually passed in the urine.  HOME CARE  Drink enough fluids to keep your pee (urine) clear or pale yellow. This helps to get the stone out.   Strain all pee through the provided strainer. Do not pee without peeing through the strainer, not even once. If you pee the stone out, catch it. The stone may be as small as a grain of salt. Take this to your doctor.   Only take medicine as told by your doctor.   Follow up with your doctor as told.   Get follow-up X-rays as told by your doctor.  GET HELP RIGHT AWAY IF:   Your pain does not get better with medicine.   You have a fever.   Your pain increases and gets worse over 18 hours.   You have new belly (abdominal) pain.   You feel faint or pass out.  MAKE SURE YOU:   Understand these instructions.   Will watch your condition.   Will get help right away if you are not doing well or get worse.  Document Released: 06/30/2007 Document Revised: 12/31/2010 Document Reviewed: 11/08/2008 [employer] Patient Information 2012 [facility] [phone] Post Ureteroscopy With or Without Stent Instructions  Definitions:  Ureter: The duct that transports urine from the kidney to the bladder. Stent:   A plastic hollow tube that is placed  into the ureter, from the kidney to the                 bladder to prevent the ureter from swelling shut.  GENERAL INSTRUCTIONS:  Despite the fact that no skin incisions were used, the area around the ureter and bladder is raw and irritated. The stent is a foreign body which will further irritate the bladder wall. This irritation is manifested by increased frequency of urination, both day and night, and by an increase in the urge to urinate. In some, the urge to urinate is present almost always. Sometimes the urge is strong enough that you may not be able to stop yourself from urinating. The only real cure is to remove the stent and then give time for the bladder wall to heal which can't be done until the danger of the ureter swelling shut has passed, which varies.  You may see some blood in your urine while the stent is in place and a few days afterwards. Do not be alarmed, even if the urine was clear for a while. Get off your feet and drink lots of fluids until clearing  occurs. If you start to pass clots or don't improve, call us.  DIET: You may return to your normal diet immediately. Because of the raw surface of your bladder, alcohol, spicy foods, acid type foods and drinks with caffeine may cause irritation or frequency and should be used in moderation. To keep your urine flowing freely and to avoid constipation, drink plenty of fluids during the day ( 8-10 glasses ). Tip: Avoid cranberry juice because it is very acidic.  ACTIVITY: Your physical activity doesn't need to be restricted. However, if you are very active, you may see some blood in your urine. We suggest that you reduce your activity under these circumstances until the bleeding has stopped.  BOWELS: It is important to keep your bowels regular during the postoperative period. Straining with bowel movements can cause bleeding. A bowel movement every other day is reasonable. Use a mild laxative if needed, such as Milk of Magnesia 2-3  tablespoons, or 2 Dulcolax tablets. Call if you continue to have problems. If you have been taking narcotics for pain, before, during or after your surgery, you may be constipated. Take a laxative if necessary.   MEDICATION: You should resume your pre-surgery medications unless told not to. In addition you will often be given an antibiotic to prevent infection. These should be taken as prescribed until the bottles are finished unless you are having an unusual reaction to one of the drugs.  PROBLEMS YOU SHOULD REPORT TO [place]:  Fevers over 100.5 Fahrenheit.  Heavy bleeding, or clots ( See above notes about blood in urine ).  Inability to urinate.  Drug reactions ( hives, rash, nausea, vomiting, diarrhea ).  Severe burning or pain with urination that is not improving.  FOLLOW-UP: You will need a follow-up appointment to monitor your progress. Call for this appointment at the number listed above. Usually the first appointment will be about three to fourteen days after your surgery.       Post Anesthesia Home Care Instructions  Activity: Get plenty of rest for the remainder of the day. A responsible adult should stay with you for 24 hours following the procedure.  For the next 24 hours, DO NOT: -Drive a car -Advertising copywriter -Drink alcoholic beverages -Take any medication unless instructed by your physician -Make any legal decisions or sign important papers.  Meals: Start with liquid foods such as gelatin or soup. Progress to regular foods as tolerated. Avoid greasy, spicy, heavy foods. If nausea and/or vomiting occur, drink only clear liquids until the nausea and/or vomiting subsides. Call your physician if vomiting continues.  Special Instructions/Symptoms: Your throat may feel dry or sore from the anesthesia or the breathing tube placed in your throat during surgery. If this causes discomfort, gargle with warm salt water. The discomfort should disappear within 24 hours.  C.

## 2011-07-21 ENCOUNTER — Encounter (HOSPITAL_BASED_OUTPATIENT_CLINIC_OR_DEPARTMENT_OTHER): Payer: Self-pay | Admitting: Urology

## 2011-07-21 NOTE — Anesthesia Postprocedure Evaluation (Signed)
  Anesthesia Post-op Note  Patient: Steven Madden  Procedure(s) Performed: Procedure(s) (LRB): CYSTOSCOPY WITH RETROGRADE PYELOGRAM/URETERAL STENT PLACEMENT (Right)  Patient Location: PACU  Anesthesia Type: General  Level of Consciousness: awake and alert   Airway and Oxygen Therapy: Patient Spontanous Breathing  Post-op Pain: mild  Post-op Assessment: Post-op Vital signs reviewed, Patient's Cardiovascular Status Stable, Respiratory Function Stable, Patent Airway and No signs of Nausea or vomiting  Post-op Vital Signs: stable  Complications: No apparent anesthesia complications

## 2011-08-27 ENCOUNTER — Other Ambulatory Visit: Payer: Self-pay | Admitting: Urology

## 2011-09-01 ENCOUNTER — Encounter (HOSPITAL_COMMUNITY): Payer: Self-pay | Admitting: Pharmacy Technician

## 2011-09-08 ENCOUNTER — Inpatient Hospital Stay (HOSPITAL_COMMUNITY): Admission: RE | Admit: 2011-09-08 | Payer: BC Managed Care – PPO | Source: Ambulatory Visit

## 2011-09-13 ENCOUNTER — Encounter (HOSPITAL_COMMUNITY): Admission: RE | Payer: Self-pay | Source: Ambulatory Visit

## 2011-09-13 ENCOUNTER — Ambulatory Visit (HOSPITAL_COMMUNITY): Admission: RE | Admit: 2011-09-13 | Payer: BC Managed Care – PPO | Source: Ambulatory Visit | Admitting: Urology

## 2011-09-13 SURGERY — CYSTOSCOPY/RETROGRADE/URETEROSCOPY
Anesthesia: General | Laterality: Right

## 2011-09-23 ENCOUNTER — Encounter (HOSPITAL_COMMUNITY): Payer: Self-pay | Admitting: Pharmacy Technician

## 2011-09-29 NOTE — Patient Instructions (Signed)
20 HARLES EVETTS  09/29/2011   Your procedure is scheduled on:  10/04/11 245pm-345pm  Report to [facility] at 1215pm.  Call this number if you have problems the morning of surgery: [phone]   Remember:   Do not eat food:After Midnight.  May have clear liquids:until 08010am then npo .  Clear liquids include soda, tea, black coffee, apple or grape juice, broth.  Take these medicines the morning of surgery with A SIP OF WATER:    Do not wear jewelry,   Do not wear lotions, powders, or perfumes.   . Men may shave face and neck.  Do not bring valuables to the hospital.  Contacts, dentures or bridgework may not be worn into surgery.       Patients discharged the day of surgery will not be allowed to drive home.  Name and phone number of your driver  Special Instructions: CHG Shower Use Special Wash: 1/2 bottle night before surgery and 1/2 bottle morning of surgery. shower chin to toes with CHG. Wash face and private parts with regular soap.    Please read over the following fact sheets that you were given: MRSA Information, coughing and deep breathing exercises, leg exercises

## 2011-09-30 ENCOUNTER — Inpatient Hospital Stay (HOSPITAL_COMMUNITY): Admission: RE | Admit: 2011-09-30 | Discharge: 2011-09-30 | Payer: BC Managed Care – PPO | Source: Ambulatory Visit

## 2011-10-01 ENCOUNTER — Encounter (HOSPITAL_COMMUNITY): Payer: Self-pay | Admitting: *Deleted

## 2011-10-01 NOTE — Pre-Procedure Instructions (Signed)
Pt states Intermittent rash to left calf due to sweat

## 2011-10-04 ENCOUNTER — Encounter (HOSPITAL_COMMUNITY): Admission: RE | Payer: Self-pay | Source: Ambulatory Visit

## 2011-10-04 ENCOUNTER — Ambulatory Visit (HOSPITAL_COMMUNITY): Admission: RE | Admit: 2011-10-04 | Payer: BC Managed Care – PPO | Source: Ambulatory Visit | Admitting: Urology

## 2011-10-04 SURGERY — CYSTOURETEROSCOPY, WITH RETROGRADE PYELOGRAM AND STENT INSERTION
Anesthesia: Choice | Laterality: Right

## 2011-12-27 ENCOUNTER — Ambulatory Visit: Payer: Self-pay | Admitting: Gastroenterology

## 2012-03-25 ENCOUNTER — Observation Stay: Payer: Self-pay

## 2012-03-25 LAB — URINALYSIS, COMPLETE
Bilirubin,UR: NEGATIVE
Ketone: NEGATIVE
Leukocyte Esterase: NEGATIVE
Nitrite: NEGATIVE
Protein: NEGATIVE
RBC,UR: 4 /HPF (ref 0–5)
WBC UR: 1 /HPF (ref 0–5)

## 2012-03-25 LAB — COMPREHENSIVE METABOLIC PANEL
Anion Gap: 7 (ref 7–16)
Creatinine: 1.07 mg/dL (ref 0.60–1.30)
EGFR (African American): >60
Glucose: 105 mg/dL — ABNORMAL HIGH (ref 65–99)
SGOT(AST): 28 U/L (ref 15–37)
SGPT (ALT): 34 U/L (ref 12–78)
Total Protein: 7.5 g/dL (ref 6.4–8.2)

## 2012-03-25 LAB — CBC
HCT: 47 % (ref 40.0–52.0)
MCHC: 33 g/dL (ref 32.0–36.0)
MCV: 95 fL (ref 80–100)

## 2012-03-25 LAB — TROPONIN I: Troponin-I: <0.02 ng/mL

## 2012-03-26 LAB — CBC WITH DIFFERENTIAL/PLATELET
Basophil %: 1.5 %
HCT: 42.4 % (ref 40.0–52.0)
HGB: 14.1 g/dL (ref 13.0–18.0)
Lymphocyte #: 1.9 10*3/uL (ref 1.0–3.6)
Lymphocyte %: 35.3 %
MCH: 31.5 pg (ref 26.0–34.0)
MCHC: 33.2 g/dL (ref 32.0–36.0)
Neutrophil #: 2.9 10*3/uL (ref 1.4–6.5)
Neutrophil %: 51.6 %
Platelet: 160 10*3/uL (ref 150–440)
RBC: 4.46 10*6/uL (ref 4.40–5.90)
RDW: 13.3 % (ref 11.5–14.5)

## 2012-03-26 LAB — BASIC METABOLIC PANEL
BUN: 16 mg/dL (ref 7–18)
Calcium, Total: 8.6 mg/dL (ref 8.5–10.1)
Glucose: 91 mg/dL (ref 65–99)
Potassium: 4 mmol/L (ref 3.5–5.1)
Sodium: 140 mmol/L (ref 136–145)

## 2012-03-26 LAB — TROPONIN I: Troponin-I: <0.02 ng/mL

## 2012-04-20 ENCOUNTER — Encounter: Payer: Self-pay | Admitting: Rheumatology

## 2012-04-25 ENCOUNTER — Encounter: Payer: Self-pay | Admitting: Rheumatology

## 2012-11-06 ENCOUNTER — Ambulatory Visit: Payer: Self-pay | Admitting: Internal Medicine

## 2012-11-11 ENCOUNTER — Ambulatory Visit: Payer: Self-pay | Admitting: Internal Medicine

## 2014-01-17 ENCOUNTER — Ambulatory Visit: Payer: Self-pay

## 2014-03-18 ENCOUNTER — Telehealth: Payer: Self-pay | Admitting: Podiatry

## 2014-03-18 NOTE — Telephone Encounter (Signed)
[facility] HE WANTS A COPY OPF RECORDS AND DISC.HIS # IS [id number].

## 2014-05-17 NOTE — H&P (Signed)
PATIENT NAME:  Steven Madden, Steven Madden MR#:  [id number] DATE OF BIRTH:  [birth year]  DATE OF ADMISSION:  03/25/2012  PRIMARY CARE PHYSICIAN: Danella PentonMark F. Miller, MD  REFERRING PHYSICIAN: Sheryl L. Mindi JunkerGottlieb, MD  CHIEF COMPLAINT: Altered mental status, confusion, with elevated blood pressure.   HISTORY OF PRESENT ILLNESS: The patient is a very nice 59 year old gentleman with history of hyperlipidemia, depression, passed kidney stones, anxiety, generalized anxiety disorder and severe osteoarthritis.   The patient comes with a history of going to visit his mom, feeling fine in the morning, getting in the car and starting to get confused, not knowing where he was going, not knowing where he was, unable to process thoughts, for what the wife got really scared. The problem lasted for somewhere around 20 minutes and then it started clearing off, but it did not resolve up until he was here in the ER and got treatment for his blood pressure. He has never had any significant headache that he can remember, although the wife thinks that he might have had a headache while he was confused. He did not have any speech problems, droop of his mouth, weakness of any part of his body, difficulty walking, ataxia or balance problems. He was never dizzy or lightheaded. The patient he came to the ER. In the ER, he was found to have elevated blood pressure up to 180/117 with a pulse around 60s. The patient was given clonidine and the problem improved. The patient started getting better. Right now, he is totally asymptomatic. The patient was admitted to monitor his blood pressure, start blood pressure treatment and make sure that he does not have complications of this malignant hypertension/hypertensive encephalopathy.   REVIEW OF SYSTEMS: CONSTITUTIONAL: Denies any fever, fatigue, weakness, weight loss or weight gain.  EYES: Denies any blurry vision at this moment, but he had some mild blurry vision whenever he was having the headache.   ENT: No difficulty swallowing. No postnasal drip.  RESPIRATORY: No cough, wheezing, COPD, painful respirations.  CARDIOVASCULAR: No chest pain, orthopnea or edema.  GASTROINTESTINAL: No nausea, vomiting, abdominal pain, constipation, diarrhea or rectal bleeding.  GENITOURINARY: No dysuria, hematuria or changes in urinary pattern.  ENDOCRINE: No polyuria, polydipsia or polyphagia. No cold or heat intolerance.  HEMATOLOGIC AND LYMPHATIC: No easy bruising, anemia or swollen glands.  SKIN: No acne. No rashes or other problems.  MUSCULOSKELETAL: No significant neck pain, back pain, shoulder pain. He does have significant pain in knees, shoulders, elbows due to severe osteoarthritis. No gout. No swollen joints.  NEUROLOGIC: No numbness, tingling. Positive confusion earlier. Negative for CVAs or TIAs in the past.  PSYCHIATRIC: No anxiety at this moment, but he has generalized anxiety with previous episodes of panic attacks. The patient is stressed out a lot.   PAST MEDICAL HISTORY:  1.  Hyperlipidemia.  2.  Depression.  3.  Kidney stones.  4.  Anxiety.  5.  Generalized anxiety disorder.  6.  Osteoarthritis.   ALLERGIES: EGGS AND PENICILLIN.   PAST SURGICAL HISTORY: Multiple surgical interventions including a left total knee replacement followed by revision. Positive shoulder surgery and wrist surgery.   FAMILY HISTORY: Positive for MI in her mom, who had a heart attack several months after his dad was in a car accident, and he states that she had a heart attack due to stress. No other heart conditions. No diabetes, hypertension or strokes.   SOCIAL HISTORY: The patient does not smoke, does not drink. He is married. He exercises a lot.  He is very active. He lives with his wife.   MEDICATIONS: Zoloft 50 mg once daily, Xanax 0.5 mg 3 times a day, Norco p.r.n. pain, doxycycline twice daily, no longer taking Celebrex 200 mg once a day, aspirin 81 mg once a day. His Celebrex has been changed to  Mobic, and apparently the patient took his first dose yesterday and the second today, and he seems to feel that Mobic had a lot to do with this episode. At this moment, I cannot prove or disprove that the Mobic was the cause of his increased high blood pressure or confusion. All I can say is that it is not a clinical or frequent side effect of that medication.   PHYSICAL EXAMINATION:  VITAL SIGNS: Blood pressure 179/117, pulse 65, respiratory rate 18, temperature 98.2, oxygen 99% on room air.  GENERAL: Alert, oriented x 3. No acute distress. No respiratory distress. Hemodynamically stable.  HEENT: Pupils are equal and reactive. Extraocular movements are intact. Mucosae are moist. Unable to do a fundal exam due to contraction of pupils due to lights. No oral lesions. No oropharyngeal exudates.  NECK: Supple. No JVD. No thyromegaly. No adenopathy. No carotid bruits. No rigidity.  CARDIOVASCULAR: Regular rate and rhythm. No murmurs, rubs or gallops.  LUNGS: Clear without any wheezing or crepitus. No use of accessory muscles.  HEART: There is no displacement of PMI. No tenderness to palpation of anterior chest wall.  ABDOMEN: Soft, nontender, nondistended. No hepatosplenomegaly. No masses. Bowel sounds are positive. No renal artery bruits.  GENITAL: Deferred.  EXTREMITIES: No edema, no cyanosis, no clubbing. Pulses +2.  NEUROLOGICAL: Cranial nerves II through XII intact. Strength is 5/5 in 4 extremities. Rapid alternating movements are normal. No cerebellar signs or symptoms at this moment. Balance is okay. Romberg is negative. Ambulation is normal without any ataxia or lateralization. No nystagmus.  PSYCHIATRIC: Mood is normal without any signs of severe anxiety or severe depression at this moment.  SKIN: Without any rashes or petechiae.  LYMPHATIC: Negative for lymphadenopathy in neck or supraclavicular areas.  MUSCULOSKELETAL: No significant joint deformities or swelling at this moment.    LABORATORY AND DIAGNOSTIC DATA: Her lab work is normal with a glucose only of 105, creatinine 1.07, sodium 141, potassium 3.6. LFTs within normal limits. CBC within normal limits with a hemoglobin of 15, white count of 6.2. Urinalysis with no protein, negative signs of infection. EKG: Normal sinus rhythm, slightly bradycardic.   ASSESSMENT AND PLAN: A 59 year old gentleman with history of hyperlipidemia, depression, kidney stones, anxiety, generalized anxiety disorder and osteoarthritis, comes with altered mental status and severe increase in his blood pressure.  1.  Malignant hypertension/hypertensive encephalopathy: The patient presented with symptoms related to confusion, altered mental status, that lasted until his blood pressure was under better control. Likely this is related to some cerebral edema related to the hypertension. CT scan did not show any significant findings. The only final proof to check for cerebral edema or posterior reversible encephalopathy syndrome would be an MRI, but I do not think at this moment since his symptoms have resolved we will be adding on any type of new information. His blood pressure still remains elevated, for what we are going to treat it slowly. I am going to add on amlodipine, which the patient could be discharged on, and we are going to add on as-needed medications including hydralazine and clonidine. The patient has been under a lot of stress and under a lot of pain due to the fact  that he has been off Celebrex for quite a while and restarted on Mobic for 2 days, which actually made him feel really good as far as osteoarthritis, but the patient is afraid to continue to take this medication due to the fact that he feels like this episode could have been brought on by the Mobic. At this moment, I cannot prove or disprove that the Mobic had anything to do with that. Overall, there are no other signs of organ damage, no changes in EKG. We are going to follow up on  cardiac enzymes and we are also going to recommend workup for secondary hypertension. At this moment, the patient does not really want to stay in the hospital, but he will stay overnight. I think that workup for secondary hypertension could be done outpatient. Factors that are influencing his blood pressure right now are the pain and severe anxiety, so I will let his primary care physician, Dr. Hyacinth Meeker, continue with treatment. Recommend to get an echocardiogram, 24-hour urine for VMA and metanephrines. His potassium is fine and his sodium is fine, for what I do not think he needs to have significant workup for hyperaldosteronism. Vascular-wise, he is not a smoker and he is not a drinker, for what I doubt that he could have any aortic disease, but the echocardiogram could give an answer to that. At this moment, I am not going to add on any other workup, as the patient wants to be discharged tomorrow if his blood pressure is better.  2.  Hyperlipidemia: Recommend to continue statin, as the patient stopped it because he started Mobic. Continue aspirin 81 mg a day.  3.  History of anxiety: Continue current medications that include Xanax and Zoloft.  4.  Osteoarthritis: Continue Norco I recommend to get back on Celebrex and this can be ordered by his primary care physician, at least get some approval from the insurance.  5.  Other medical problems are looking okay. The patient is a full code. Proton pump inhibitor for gastrointestinal prophylaxis. Deep vein thrombosis prophylaxis with mechanical devices.   TIME SPENT: I spent about 45 minutes with this admission. Will transfer care to Dr. Hyacinth Meeker.   ____________________________ Felipa Furnace, MD [email or web address] D: 03/25/2012 19:20:36 ET T: 03/25/2012 21:00:36 ET JOB#: [id number]  cc: Felipa Furnace, MD, <Dictator> Jonmarc Bodkin Juanda Chance MD ELECTRONICALLY SIGNED 04/11/2012 12:49

## 2014-05-17 NOTE — Discharge Summary (Signed)
PATIENT NAME:  Steven Madden, Steven Madden MR#:  [id number] DATE OF BIRTH:  1955-04-12  DATE OF ADMISSION:  03/25/2012 DATE OF DISCHARGE:  03/26/2012  PRIMARY CARE PHYSICIAN: Bethann PunchesMark Miller.   DISCHARGE DIAGNOSES: 1. Altered mental status.  2. Hypertensive emergency.   HISTORY OF PRESENT ILLNESS: Please see admission history and physical. Briefly, this is a pleasant, healthy gentleman who recently started Mobic for pain, which is chronic in nature. He reports that he became confused. His wife says he did not know where they were going, was asking funny questions. He had no other neurological findings. He was brought to the Emergency Room where he was found to have markedly elevated blood pressure. He was treated with clonidine. Blood pressure improved and his symptoms completely resolved. He was monitored overnight.   HOSPITAL COURSE BY ISSUE:  Altered mental status: The patient had blood work, including a Advertising copywritercomprehensive panel. Was all normal, as well as his troponins, which were negative. Blood count, which was normal, and urine, which was negative. He also had a CT of his head which showed no acute disease. Chest x-ray was normal as well. It was presumed that this was due to hypertensive emergency and encephalopathy. He improved remarkably overnight and he is back to his baseline today.  Hypertension: This is a new diagnosis for the patient, although he says it has been creeping up over the last several years. He has not been on antihypertensives as an outpatient. We will send him home on amlodipine, 2.5 mg. We will follow up with his primary care, Dr. Hyacinth MeekerMiller, to decide on further antihypertensive management.  Chronic pain: THE PATIENT WILL NO LONGER USE MOBIC, AS HE SEEMED TO REACT TO THIS. He will continue on his Norco, which he uses very sparingly. He will attempt to get his insurance to approve Celebrex, which he had been on for many years. It does seem like he reacted poorly to the Mobic, and would likely  benefit from the Celebrex.  Depression: He will continue on his Zoloft and alprazolam as needed. There was no evidence of serotonin syndrome with this admission.   DISCHARGE DIET: Low salt, regular diet.   DISCHARGE ACTIVITY: As tolerated.   DISCHARGE FOLLOW UP:  The patient will follow up with Dr. Hyacinth MeekerMiller in 1 to 2 weeks.   This discharge was an observation discharge.    ____________________________ Stann Mainlandavid P. Sampson GoonFitzgerald, MD dpf:dm D: 03/26/2012 12:31:00 ET T: 03/26/2012 15:43:48 ET JOB#: [id number]  cc: Stann Mainlandavid P. Sampson GoonFitzgerald, MD, <Dictator> Heiley Shaikh Sampson GoonFITZGERALD MD ELECTRONICALLY SIGNED 03/29/2012 19:27

## 2014-05-19 NOTE — Op Note (Signed)
PATIENT NAME:  Steven Madden, Steven Madden MR#:  [id number] DATE OF BIRTH:  [birth year]  DATE OF PROCEDURE:  01/18/2011  PREOPERATIVE DIAGNOSES: Torn medial meniscus, right knee plus multiple chondral lesions.   POSTOPERATIVE DIAGNOSES: Torn medial meniscus, right knee plus multiple chondral lesions.  PROCEDURES: Arthroscopic partial medial meniscectomy plus debridement and Coblation of multiple chondral lesions.   SURGEON: Alda BertholdHarold B. Douglas Rooks Jr., MD  ANESTHESIA: General.   HISTORY: Patient had a long history of right knee pain. He had had a remote arthroscopy of his right knee. His current films reveal narrowing of his medial compartment. MRI was consistent with multicompartment chondral changes plus a torn medial meniscus.   Patient was ultimately brought in for surgery because he had been refractory to injection of his right knee with steroid and anesthetic.   DESCRIPTION OF PROCEDURE: Patient taken to the Operating Room where satisfactory general anesthesia was achieved. A tourniquet and legholder were applied to the right thigh and the left lower extremity was supported with a well legholder. The right knee was prepped and draped in the usual fashion for an arthroscopic procedure. An inflow cannula was introduced superomedially. A moderate amount of clear synovial fluid was obtained from the knee joint. The knee joint was distended with lactated Ringer'Madden. We used the Mitek fluid pump to facilitate joint distention.   The scope was introduced through an inferolateral puncture wound and a probe through an inferomedial puncture wound. Inspection of the medial compartment revealed the patient had a torn medial meniscus. The middle third was the most involved. Patient had grade 3 and 4 chondral lesions of the weight-bearing portion of the medial femoral condyle and tibial plateau.   I went ahead and inserted a Turbowhisker in the medial compartment and debrided the frayed articular surface. I then used an  Heritage managerArthroCare paragon wand to smooth over the femoral articular surface. After the roughened femoral chondral surface was sculptured and sealed with the paragon wand I microfractured a grade 4 chondral lesion along the medial aspect of the weight-bearing portion of the medial femoral condyle and medial aspect of the weight-bearing portion of the tibial plateau.   The torn medial meniscus was resected with a motorized cutter and then I went ahead and visualized the intercondylar notch. The cruciates appeared to be intact.   Visualization of the lateral compartment revealed that the patient had an intact medial meniscus, but he had a large grade 3 chondral lesion in the mid weight-bearing portion of his lateral femoral condyle. This was debrided with a Turbowhisker and then smoothed over with an Engineer, manufacturingArthroCare paragon wand.   Inspection of the trochlear groove revealed the patient had a grade 3 chondral lesion in this area. This area was also debrided. Retropatellar surface was frayed. I went ahead and debrided it with a Turbowhisker.  I went ahead and decompressed the joint through the cannulas at this time and the cannulas were removed. The puncture wounds were closed with 3-0 nylon in vertical mattress fashion. Several mL of 0.5% Marcaine with epinephrine were injected about each puncture wound and Betadine was applied to the wounds followed by a sterile dressing and an ice pack.   Patient was then awakened and transferred to his stretcher bed. He was taken to recovery room in satisfactory condition. Blood loss was negligible. Tourniquet incidentally was not inflated during the course of the procedure.    ____________________________ Alda BertholdHarold B. Julious Langlois Jr., MD hbk:cms D: 01/18/2011 09:59:11 ET T: 01/18/2011 11:12:24 ET JOB#: [id number]  cc: Jake SharkHarold B.  Royann Shivers., MD, <Dictator> Randon Goldsmith, JR MD ELECTRONICALLY SIGNED 02/21/2011 14:34

## 2014-05-28 ENCOUNTER — Other Ambulatory Visit: Payer: Self-pay

## 2014-05-28 ENCOUNTER — Emergency Department
Admission: EM | Admit: 2014-05-28 | Discharge: 2014-05-28 | Disposition: A | Discharge status: Home / Self Care | Payer: Self-pay | Attending: Emergency Medicine | Admitting: Emergency Medicine

## 2014-05-28 DIAGNOSIS — Z880 Allergy status to penicillin: Secondary | ICD-10-CM | POA: Insufficient documentation

## 2014-05-28 DIAGNOSIS — N189 Chronic kidney disease, unspecified: Secondary | ICD-10-CM | POA: Insufficient documentation

## 2014-05-28 DIAGNOSIS — I129 Hypertensive chronic kidney disease with stage 1 through stage 4 chronic kidney disease, or unspecified chronic kidney disease: Secondary | ICD-10-CM | POA: Insufficient documentation

## 2014-05-28 DIAGNOSIS — Z91040 Latex allergy status: Secondary | ICD-10-CM | POA: Insufficient documentation

## 2014-05-28 DIAGNOSIS — I10 Essential (primary) hypertension: Secondary | ICD-10-CM

## 2014-05-28 DIAGNOSIS — F419 Anxiety disorder, unspecified: Secondary | ICD-10-CM | POA: Insufficient documentation

## 2014-05-28 DIAGNOSIS — R0789 Other chest pain: Secondary | ICD-10-CM | POA: Insufficient documentation

## 2014-05-28 DIAGNOSIS — Z7982 Long term (current) use of aspirin: Secondary | ICD-10-CM | POA: Insufficient documentation

## 2014-05-28 LAB — COMPREHENSIVE METABOLIC PANEL
ALBUMIN: 4.2 g/dL (ref 3.5–5.0)
ALT: 22 U/L (ref 17–63)
ANION GAP: 8 (ref 5–15)
AST: 23 U/L (ref 15–41)
Alkaline Phosphatase: 91 U/L (ref 38–126)
BUN: 15 mg/dL (ref 6–20)
CALCIUM: 9.6 mg/dL (ref 8.9–10.3)
CO2: 27 mmol/L (ref 22–32)
CREATININE: 0.99 mg/dL (ref 0.61–1.24)
Chloride: 102 mmol/L (ref 101–111)
GFR calc Af Amer: >60 mL/min (ref >60)
Glucose, Bld: 126 mg/dL — ABNORMAL HIGH (ref 65–99)
Potassium: 3.3 mmol/L — ABNORMAL LOW (ref 3.5–5.1)
Sodium: 137 mmol/L (ref 135–145)
Total Bilirubin: 0.7 mg/dL (ref 0.3–1.2)
Total Protein: 7.2 g/dL (ref 6.5–8.1)

## 2014-05-28 LAB — URINALYSIS COMPLETE WITH MICROSCOPIC (ARMC ONLY)
BACTERIA UA: NONE SEEN
BILIRUBIN URINE: NEGATIVE
GLUCOSE, UA: NEGATIVE mg/dL
Hgb urine dipstick: NEGATIVE
KETONES UR: NEGATIVE mg/dL
Leukocytes, UA: NEGATIVE
Nitrite: NEGATIVE
Protein, ur: NEGATIVE mg/dL
RBC / HPF: NONE SEEN RBC/hpf (ref 0–5)
SPECIFIC GRAVITY, URINE: 1.013 (ref 1.005–1.030)
SQUAMOUS EPITHELIAL / LPF: NONE SEEN
pH: 6 (ref 5.0–8.0)

## 2014-05-28 LAB — CBC
HEMATOCRIT: 45.9 % (ref 40.0–52.0)
HEMOGLOBIN: 15.2 g/dL (ref 13.0–18.0)
MCH: 30.5 pg (ref 26.0–34.0)
MCHC: 33 g/dL (ref 32.0–36.0)
MCV: 92.2 fL (ref 80.0–100.0)
Platelets: 177 10*3/uL (ref 150–440)
RBC: 4.97 MIL/uL (ref 4.40–5.90)
RDW: 13 % (ref 11.5–14.5)
WBC: 7.7 10*3/uL (ref 3.8–10.6)

## 2014-05-28 LAB — TROPONIN I: TROPONIN I: 0 ng/mL (ref <0.031)

## 2014-05-28 NOTE — ED Notes (Signed)
Pt in with co hypertension and increased bilat lower leg edema.  Increased reflux today, pain to chest and mid back, has dizziness at times.

## 2014-05-28 NOTE — ED Provider Notes (Signed)
[facility] Emergency Department Provider Note  Time seen: 4:56 AM  I have reviewed the triage vital signs and the nursing notes.   HISTORY  Chief Complaint Hypertension    HPI Steven Madden is a 59 y.o. male with a past medical history of anxiety, arthritis, hypertension presents to the emergency department today with elevated blood pressure and chest tightness. According to the patient he was feeling jittery/anxious at home so he took his blood pressure and it was approximately 150/90. This concerned the patient so he came to the emergency department for evaluation. Patient also states he has been having intermittent chest tightness symptoms with his elevated blood pressure. Denies any "pain." Patient has been taking his blood pressure medication as prescribed, but is concerned that it continues to fluctuate.     Past Medical History  Diagnosis Date  . Anxiety     takes Zoloft  . Mental disorder     panic disorder . Rarely takes Xanax  . Arthritis     knees  . Chronic kidney disease     Rt UPJ stone  . Pneumonia     age 59 yr old    There are no active problems to display for this patient.   Past Surgical History  Procedure Laterality Date  . Shoulder arthroscopy distal clavicle excision and open rotator cuff repair  8 yrs ago    Right shoulder  . Wrist arthroscopy  23 yr ago  . Joint replacement      lt knee replacement x 2  . Total knee left  last done 8 yrs ago    x 2  . Knee arthroscopy  yrs ago    left knee  . Cystoscopy w/ ureteral stent placement  07/20/2011    Procedure: CYSTOSCOPY WITH RETROGRADE PYELOGRAM/URETERAL STENT PLACEMENT;  Surgeon: Kathi LudwigSigmund I Tannenbaum, MD;  [facility];  Service: Urology;  Laterality: Right;  . Right knee arthroscopy  dec 2012 last done    x 2    Current Outpatient Rx  Name  Route  Sig  Dispense  Refill  . ALPRAZolam (XANAX) 0.5 MG tablet   Oral   Take 0.5 mg by mouth as  needed. anxiety         . aspirin EC 81 MG tablet   Oral   Take 81 mg by mouth daily.         . clotrimazole (LOTRIMIN) 1 % cream   Topical   Apply 1 application topically 2 (two) times daily.         Marland Kitchen. HYDROcodone-acetaminophen (NORCO) 7.5-325 MG per tablet   Oral   Take 1 tablet by mouth every 6 (six) hours as needed. Pain         . sertraline (ZOLOFT) 50 MG tablet   Oral   Take 50 mg by mouth daily with breakfast.          . tetrahydrozoline-zinc (VISINE-AC) 0.05-0.25 % ophthalmic solution   Both Eyes   Place 2 drops into both eyes as needed. Itchy eyes           Allergies Latex and Penicillins  No family history on file.  Social History History  Substance Use Topics  . Smoking status: Never Smoker   . Smokeless tobacco: Never Used  . Alcohol Use: Yes     Comment: very rare    Review of Systems Constitutional: Negative for fever. Eyes: Negative for visual changes. Cardiovascular: Negative for chest pain, but  positive for intermittent chest tightness. Respiratory: Negative for shortness of breath. Gastrointestinal: Negative for abdominal pain  10-point ROS otherwise negative.  ____________________________________________   PHYSICAL EXAM:  VITAL SIGNS: ED Triage Vitals  Enc Vitals Group     BP 05/28/14 0141 134/83 mmHg     Pulse Rate 05/28/14 0141 67     Resp --      Temp 05/28/14 0141 98.3 F (36.8 C)     Temp Source 05/28/14 0141 Oral     SpO2 05/28/14 0141 97 %     Weight 05/28/14 0141 225 lb (102.059 kg)     Height 05/28/14 0141  (1.803 m)     Head Cir --      Peak Flow --      Pain Score 05/28/14 0142 5     Pain Loc --      Pain Edu? --      Excl. in GC? --     Constitutional: Alert and oriented. Well appearing and in no distress. Cardiovascular: Normal rate, regular rhythm. No murmurs, rubs, or gallops. Respiratory: Normal respiratory effort without tachypnea nor retractions. Breath sounds are clear and equal  bilaterally. No wheezes/rales/rhonchi. Gastrointestinal: Soft and nontender. Musculoskeletal: Nontender with normal range of motion in all extremities. Mild bilateral lower extremity edema Neurologic:  Normal speech and language. No gross focal neurologic deficits  Skin:  Skin is warm Psychiatric: Mood and affect are normal. Speech and behavior are normal.  ____________________________________________    EKG  Normal sinus rhythm at 60 bpm, narrow QRS, normal axis and intervals, no ST changes. Overall normal EKG.  ____________________________________________      INITIAL IMPRESSION / ASSESSMENT AND PLAN / ED COURSE  Pertinent labs & imaging results that were available during my care of the patient were reviewed by me and considered in my medical decision making (see chart for details).  59 year old male who presents to the emergency department with intermittent chest tightness for the last 2 days as well as elevated blood pressure today. Patient's workup including labs and EKG appear normal. Blood pressure appears well at this time. Discussed with the patient follow-up with his primary care Bethann Punches. Patient is agreeable to this plan. We'll discharge home.  ____________________________________________   FINAL CLINICAL IMPRESSION(S) / ED DIAGNOSES  Hypertension   Minna Antis, MD 05/28/14 0500

## 2014-05-28 NOTE — ED Notes (Signed)
Discharge instructions reviewed with patient. Questions fielded by this RN. Patient verbalizes understanding of instructions. Patient discharged home in stable condition per XXX . No acute distress noted at time of discharge. No peripheral IV placed this visit.  

## 2014-11-17 ENCOUNTER — Encounter: Payer: Self-pay | Admitting: Emergency Medicine

## 2014-11-17 ENCOUNTER — Emergency Department
Admission: EM | Admit: 2014-11-17 | Discharge: 2014-11-17 | Disposition: A | Discharge status: Home / Self Care | Payer: Self-pay | Attending: Emergency Medicine | Admitting: Emergency Medicine

## 2014-11-17 DIAGNOSIS — Z91040 Latex allergy status: Secondary | ICD-10-CM | POA: Insufficient documentation

## 2014-11-17 DIAGNOSIS — Z7982 Long term (current) use of aspirin: Secondary | ICD-10-CM | POA: Insufficient documentation

## 2014-11-17 DIAGNOSIS — Z79899 Other long term (current) drug therapy: Secondary | ICD-10-CM | POA: Insufficient documentation

## 2014-11-17 DIAGNOSIS — F32A Depression, unspecified: Secondary | ICD-10-CM

## 2014-11-17 DIAGNOSIS — F410 Panic disorder [episodic paroxysmal anxiety] without agoraphobia: Secondary | ICD-10-CM

## 2014-11-17 DIAGNOSIS — Z880 Allergy status to penicillin: Secondary | ICD-10-CM | POA: Insufficient documentation

## 2014-11-17 DIAGNOSIS — F329 Major depressive disorder, single episode, unspecified: Secondary | ICD-10-CM | POA: Insufficient documentation

## 2014-11-17 DIAGNOSIS — R45851 Suicidal ideations: Secondary | ICD-10-CM

## 2014-11-17 HISTORY — DX: Major depressive disorder, single episode, unspecified: F32.9

## 2014-11-17 HISTORY — DX: Depression, unspecified: F32.A

## 2014-11-17 LAB — CBC
HEMATOCRIT: 46.6 % (ref 40.0–52.0)
HEMOGLOBIN: 15.6 g/dL (ref 13.0–18.0)
MCH: 30.6 pg (ref 26.0–34.0)
MCHC: 33.5 g/dL (ref 32.0–36.0)
MCV: 91.3 fL (ref 80.0–100.0)
Platelets: 173 10*3/uL (ref 150–440)
RBC: 5.1 MIL/uL (ref 4.40–5.90)
RDW: 13.6 % (ref 11.5–14.5)
WBC: 6.6 10*3/uL (ref 3.8–10.6)

## 2014-11-17 LAB — ETHANOL: Alcohol, Ethyl (B): <5 mg/dL (ref <5)

## 2014-11-17 LAB — COMPREHENSIVE METABOLIC PANEL
ALBUMIN: 4.1 g/dL (ref 3.5–5.0)
ALK PHOS: 76 U/L (ref 38–126)
ALT: 23 U/L (ref 17–63)
ANION GAP: 6 (ref 5–15)
AST: 24 U/L (ref 15–41)
BUN: 12 mg/dL (ref 6–20)
CALCIUM: 9.4 mg/dL (ref 8.9–10.3)
CO2: 26 mmol/L (ref 22–32)
Chloride: 106 mmol/L (ref 101–111)
Creatinine, Ser: 1.04 mg/dL (ref 0.61–1.24)
GFR calc Af Amer: >60 mL/min (ref >60)
GFR calc non Af Amer: >60 mL/min (ref >60)
GLUCOSE: 104 mg/dL — AB (ref 65–99)
Potassium: 3.7 mmol/L (ref 3.5–5.1)
SODIUM: 138 mmol/L (ref 135–145)
Total Bilirubin: 1 mg/dL (ref 0.3–1.2)
Total Protein: 7 g/dL (ref 6.5–8.1)

## 2014-11-17 LAB — ACETAMINOPHEN LEVEL

## 2014-11-17 LAB — TROPONIN I: Troponin I: <0.03 ng/mL (ref <0.031)

## 2014-11-17 LAB — SALICYLATE LEVEL: Salicylate Lvl: <4 mg/dL (ref 2.8–30.0)

## 2014-11-17 MED ORDER — SERTRALINE HCL 100 MG PO TABS
100.0000 mg | ORAL_TABLET | Freq: Every day | ORAL | Status: DC
  Administered 2014-11-17: 100 mg via ORAL
  Filled 2014-11-17: qty 1

## 2014-11-17 MED ORDER — CLONAZEPAM 0.5 MG PO TABS
0.5000 mg | ORAL_TABLET | Freq: Two times a day (BID) | ORAL | Status: DC
  Administered 2014-11-17: 0.5 mg via ORAL
  Filled 2014-11-17: qty 1

## 2014-11-17 NOTE — ED Notes (Signed)
Pt to be discharged - awaiting paperwork

## 2014-11-17 NOTE — ED Notes (Signed)
Per pt's wife pt has hx of depression and panic attacks. Wife states that patient started having a panic attack this morning and when she returned home he was on the phone with 911. When asked about thoughts of hurting himself pt states "I have lost the will to live", pt also verbalizes that he had a plan to hurt himself today but will not tell staff. Pt does not want to talk to staff and gives short responses when asked questions. Pt states that the depression has been getting worse "for a long time".

## 2014-11-17 NOTE — Discharge Instructions (Signed)
You were seen by psychiatry in the emergency department. They have released you from an involuntary commitment. [facility]. You should also follow-up with your primary physician. Return to the emergency department if you have thoughts of self-harm or other urgent concerns.

## 2014-11-17 NOTE — Consult Note (Signed)
[facility] Consult   Reason for Consult: Follow up Referring Physician:  ER Patient Identification: Steven Madden MRN:  [id number] Principal Diagnosis: <principal problem not specified> Diagnosis:  There are no active problems to display for this patient.   Total Time spent with patient: 45 minutes  Subjective:   Steven Madden is a 59 y.o. male patient admitted with panic anxiety attacks and [facility] [place] [facility] when his meds were adjusted and he was started back on Zoloft and has been on Xanax for anxiety for over 30 yrs.  HPI:  Pt has several stressors which includes loss of job" laid off" and having to undergo knee replacement,  Past Psychiatric History: Long H/O anxiety and depression./  Risk to Self: Is patient at risk for suicide?: Yes Risk to Others:   Prior Inpatient Therapy:   Prior Outpatient Therapy:    Past Medical History:  Past Medical History  Diagnosis Date  . Anxiety     takes Zoloft  . Mental disorder     panic disorder . Rarely takes Xanax  . Arthritis     knees  . Chronic kidney disease     Rt UPJ stone  . Pneumonia     age 75 yr old  . Depression     Past Surgical History  Procedure Laterality Date  . Shoulder arthroscopy distal clavicle excision and open rotator cuff repair  8 yrs ago    Right shoulder  . Wrist arthroscopy  23 yr ago  . Joint replacement      lt knee replacement x 2  . Total knee left  last done 8 yrs ago    x 2  . Knee arthroscopy  yrs ago    left knee  . Cystoscopy w/ ureteral stent placement  07/20/2011    Procedure: CYSTOSCOPY WITH RETROGRADE PYELOGRAM/URETERAL STENT PLACEMENT;  Surgeon: Ailene Rud, MD;  Location: [facility];  Service: Urology;  Laterality: Right;  . Right knee arthroscopy  dec 2012 last done    x 2   Family History: History reviewed. No pertinent family history. Family Psychiatric  History: none Social History:  History  Alcohol Use No     Comment: very rare     History  Drug Use No    Comment: 26 yr ago Harold History  . Marital Status: Married    Spouse Name: N/A  . Number of Children: N/A  . Years of Education: N/A   Social History Main Topics  . Smoking status: Never Smoker   . Smokeless tobacco: Never Used  . Alcohol Use: No     Comment: very rare  . Drug Use: No     Comment: 35 yr ago [place]  . Sexual Activity: Not Asked   Other Topics Concern  . None   Social History Narrative   Additional Social History:                          Allergies:   Allergies  Allergen Reactions  . Latex Rash  . Penicillins Rash    Labs:  Results for orders placed or performed during the hospital encounter of 11/17/14 (from the past 48 hour(s))  Comprehensive metabolic panel     Status: Abnormal   Collection Time: 11/17/14  1:59 PM  Result Value Ref Range   Sodium 138 135 - 145 mmol/L  Potassium 3.7 3.5 - 5.1 mmol/L   Chloride 106 101 - 111 mmol/L   CO2 26 22 - 32 mmol/L   Glucose, Bld 104 (H) 65 - 99 mg/dL   BUN 12 6 - 20 mg/dL   Creatinine, Ser 1.04 0.61 - 1.24 mg/dL   Calcium 9.4 8.9 - 10.3 mg/dL   Total Protein 7.0 6.5 - 8.1 g/dL   Albumin 4.1 3.5 - 5.0 g/dL   AST 24 15 - 41 U/L   ALT 23 17 - 63 U/L   Alkaline Phosphatase 76 38 - 126 U/L   Total Bilirubin 1.0 0.3 - 1.2 mg/dL   GFR calc non Af Amer >60 >60 mL/min   GFR calc Af Amer >60 >60 mL/min    Comment: (NOTE) The eGFR has been calculated using the CKD EPI equation. This calculation has not been validated in all clinical situations. eGFR's persistently <60 mL/min signify possible Chronic Kidney Disease.    Anion gap 6 5 - 15  Ethanol (ETOH)     Status: None   Collection Time: 11/17/14  1:59 PM  Result Value Ref Range   Alcohol, Ethyl (B) <5 <5 mg/dL    Comment:        LOWEST DETECTABLE LIMIT FOR SERUM ALCOHOL IS 5 mg/dL FOR MEDICAL PURPOSES ONLY   Salicylate level     Status: None    Collection Time: 11/17/14  1:59 PM  Result Value Ref Range   Salicylate Lvl <7.6 2.8 - 30.0 mg/dL  Acetaminophen level     Status: Abnormal   Collection Time: 11/17/14  1:59 PM  Result Value Ref Range   Acetaminophen (Tylenol), Serum <10 (L) 10 - 30 ug/mL    Comment:        THERAPEUTIC CONCENTRATIONS VARY SIGNIFICANTLY. A RANGE OF 10-30 ug/mL MAY BE AN EFFECTIVE CONCENTRATION FOR MANY PATIENTS. HOWEVER, SOME ARE BEST TREATED AT CONCENTRATIONS OUTSIDE THIS RANGE. ACETAMINOPHEN CONCENTRATIONS >150 ug/mL AT 4 HOURS AFTER INGESTION AND >50 ug/mL AT 12 HOURS AFTER INGESTION ARE OFTEN ASSOCIATED WITH TOXIC REACTIONS.   CBC     Status: None   Collection Time: 11/17/14  1:59 PM  Result Value Ref Range   WBC 6.6 3.8 - 10.6 K/uL   RBC 5.10 4.40 - 5.90 MIL/uL   Hemoglobin 15.6 13.0 - 18.0 g/dL   HCT 46.6 40.0 - 52.0 %   MCV 91.3 80.0 - 100.0 fL   MCH 30.6 26.0 - 34.0 pg   MCHC 33.5 32.0 - 36.0 g/dL   RDW 13.6 11.5 - 14.5 %   Platelets 173 150 - 440 K/uL  Troponin I     Status: None   Collection Time: 11/17/14  1:59 PM  Result Value Ref Range   Troponin I <0.03 <0.031 ng/mL    Comment:        NO INDICATION OF MYOCARDIAL INJURY.     No current facility-administered medications for this encounter.   Current Outpatient Prescriptions  Medication Sig Dispense Refill  . ALPRAZolam (XANAX) 0.5 MG tablet Take 0.5 mg by mouth as needed. anxiety    . aspirin EC 81 MG tablet Take 81 mg by mouth daily.    . clotrimazole (LOTRIMIN) 1 % cream Apply 1 application topically 2 (two) times daily.    Marland Kitchen HYDROcodone-acetaminophen (NORCO) 7.5-325 MG per tablet Take 1 tablet by mouth every 6 (six) hours as needed. Pain    . sertraline (ZOLOFT) 50 MG tablet Take 50 mg by mouth daily with breakfast.     .  tetrahydrozoline-zinc (VISINE-AC) 0.05-0.25 % ophthalmic solution Place 2 drops into both eyes as needed. Itchy eyes      Musculoskeletal: Strength & Muscle Tone: within normal limits Gait &  Station: normal Patient leans: N/A  Psychiatric Specialty Exam: Review of Systems  All other systems reviewed and are negative.   Blood pressure 129/89, pulse 72, temperature 98.4 F (36.9 C), temperature source Oral, resp. rate 18, height _0  (1.803 m), weight 225 lb (102.059 kg), SpO2 93 %.Body mass index is 31.39 kg/(m^2).  General Appearance: Casual  Eye Contact::  Fair  Speech:  Clear and Coherent  Volume:  Normal  Mood:  Anxious, Depressed and Worthless  Affect:  Appropriate  Thought Process:  Circumstantial  Orientation:  Full (Time, Place, and Person)  Thought Content:  Negative  Suicidal Thoughts:  No  Homicidal Thoughts:  No  Memory:  Immediate;   Fair Recent;   Fair Remote;   Fair adequate.  Judgement:  Fair  Insight:  Fair  Psychomotor Activity:  Normal  Concentration:  Fair  Recall:  [employer] of Knowledge:Fair  Language: Fair  Akathisia:  No  Handed:  Right  AIMS (if indicated):     Assets:  Communication Skills Desire for Improvement Housing Talents/Skills  ADL's:  Intact  Cognition: WNL  Sleep:      Treatment Plan Summary: Plan start pt back on Zoloft 100 mgs po daily and Klonopin low dose for anxiety. Will D/C IVC and discharege pt home with wife and will go o [facility] Out pt [facility] for help as needed  Disposition: No evidence of imminent risk to self or others at present.    Dewain Penning 11/17/2014 5:04 PM

## 2014-11-17 NOTE — ED Provider Notes (Signed)
[facility] Emergency Department Provider Note   ____________________________________________  Time seen:  I have reviewed the triage vital signs and the triage nursing note.  HISTORY  Chief Complaint Panic Attack; Depression; and Suicidal   Historian Patient  HPI Steven Madden is a 59 y.o. male reports a history of panic symptoms for years, treated by as needed Xanax, as well as Zoloft most recently. 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 Patient states that things have gotten worse over the past several months since he lost his job and separate. He has had numerous health problems including needing surgery on his neck and his right knee. Since he lost his job he can afford to pay for these. States he's had more increased anxiety, as well as depressed mood. He has become hopeless. He has had vague thoughts of suicide, without any specific plan. He is not sure what caused him to come specifically today. Symptoms are moderate to severe. He's never been admitted to the hospital for psychiatric illness.  Today during an episode of panic attack he did experience some tingling and pain down his left arm. He states that he thinks this is due to pain coming from his neck. There is no chest pain, nausea. There was some shortness of breath. The arm discomfort is gone now.    Past Medical History  Diagnosis Date  . Anxiety     takes Zoloft  . Mental disorder     panic disorder . Rarely takes Xanax  . Arthritis     knees  . Chronic kidney disease     Rt UPJ stone  . Pneumonia     age 59 yr old  . Depression     There are no active problems to display for this patient.   Past Surgical History  Procedure Laterality Date  . Shoulder arthroscopy distal clavicle excision and open rotator cuff repair  8 yrs ago    Right shoulder  . Wrist arthroscopy  23 yr ago  . Joint replacement      lt knee replacement x 2  . Total  knee left  last done 8 yrs ago    x 2  . Knee arthroscopy  yrs ago    left knee  . Cystoscopy w/ ureteral stent placement  07/20/2011    Procedure: CYSTOSCOPY WITH RETROGRADE PYELOGRAM/URETERAL STENT PLACEMENT;  Surgeon: Kathi LudwigSigmund I Tannenbaum, MD;  [facility];  Service: Urology;  Laterality: Right;  . Right knee arthroscopy  dec 2012 last done    x 2    Current Outpatient Rx  Name  Route  Sig  Dispense  Refill  . ALPRAZolam (XANAX) 0.5 MG tablet   Oral   Take 0.5 mg by mouth as needed. anxiety         . aspirin EC 81 MG tablet   Oral   Take 81 mg by mouth daily.         . clotrimazole (LOTRIMIN) 1 % cream   Topical   Apply 1 application topically 2 (two) times daily.         Marland Kitchen. HYDROcodone-acetaminophen (NORCO) 7.5-325 MG per tablet   Oral   Take 1 tablet by mouth every 6 (six) hours as needed. Pain         . sertraline (ZOLOFT) 50 MG tablet   Oral   Take 50 mg by mouth daily with breakfast.          .  tetrahydrozoline-zinc (VISINE-AC) 0.05-0.25 % ophthalmic solution   Both Eyes   Place 2 drops into both eyes as needed. Itchy eyes           Allergies Latex and Penicillins  History reviewed. No pertinent family history.  Social History Social History  Substance Use Topics  . Smoking status: Never Smoker   . Smokeless tobacco: Never Used  . Alcohol Use: No     Comment: very rare    Review of Systems  Constitutional: Negative for fever. Eyes: Negative for visual changes. ENT: Negative for sore throat. Cardiovascular: Negative for chest pain.positive for palpitations Respiratory: positivefor shortness of breath, during panic attack, now better. Gastrointestinal: Negative for abdominal pain, vomiting and diarrhea. Genitourinary: Negative for dysuria. Musculoskeletal: Negative for back pain. Skin: Negative for rash. Neurological: Negative for headache. 10 point Review of Systems otherwise  negative ____________________________________________   PHYSICAL EXAM:  VITAL SIGNS: ED Triage Vitals  Enc Vitals Group     BP 11/17/14 1353 129/89 mmHg     Pulse Rate 11/17/14 1353 72     Resp 11/17/14 1353 18     Temp 11/17/14 1353 98.4 F (36.9 C)     Temp Source 11/17/14 1353 Oral     SpO2 11/17/14 1353 93 %     Weight 11/17/14 1353 225 lb (102.059 kg)     Height 11/17/14 1353  (1.803 m)     Head Cir --      Peak Flow --      Pain Score 11/17/14 1353 8     Pain Loc --      Pain Edu? --      Excl. in GC? --      Constitutional: Alert and oriented. Anxious, depressed mood, but in no acute distress. Eyes: Conjunctivae are normal. PERRL. Normal extraocular movements. ENT   Head: Normocephalic and atraumatic.   Nose: No congestion/rhinnorhea.   Mouth/Throat: Mucous membranes are moist.   Neck: No stridor. Cardiovascular/Chest: Normal rate, regular rhythm.  No murmurs, rubs, or gallops. Respiratory: Normal respiratory effort without tachypnea nor retractions. Breath sounds are clear and equal bilaterally. No wheezes/rales/rhonchi. Gastrointestinal: Soft. No distention, no guarding, no rebound. Nontender   Genitourinary/rectal:Deferred Musculoskeletal: Nontender with normal range of motion in all extremities. No joint effusions.  No lower extremity tenderness.  No edema. Neurologic:  Normal speech and language. No gross or focal neurologic deficits are appreciated. Skin:  Skin is warm, dry and intact. No rash noted. Psychiatric: flat affect and depressed mood and overall. Positive for hopeless thoughts and vague suicidal ideation, but no specific plan.  ____________________________________________   EKG I, Governor Rooks, MD, the attending physician have personally viewed and interpreted all ECGs.  pending ____________________________________________  LABS (pertinent positives/negatives)  comprehensivemetabolic panel within normal limits Alcohol less  than 5  salicylate less than 4 Acetaminophen less than 10 CBC within normal limits   ____________________________________________  RADIOLOGY All Xrays were viewed by me. Imaging interpreted by Radiologist.  Chest x-ray portable: Pending __________________________________________  PROCEDURES  Procedure(s) performed: None  Critical Care performed: None  ____________________________________________   ED COURSE / ASSESSMENT AND PLAN  CONSULTATIONS: consulted to psychiatry social worker, and psychiatrist.  Pertinent labs & imaging results that were available during my care of the patient were reviewed by me and considered in my medical decision making (see chart for details).   In terms of the complaint of left arm discomfort that was associated with a panic attack, I am less suspicious that this is  due to an acute coronary syndrome, however I will have him evaluated EKG and laboratory studies. He is not having any chest discomfort or arm discomfort right now. He has had episodes like this before that have occurred during the panic attack and also which is positional and he thinks is due to his chronic neck pain/bulging disc.  In terms of the increased frequency of panic attacks, as well as his worsening depressed mood, hopeless attitude, decreased appetite, problems sleeping, and vague suicidal ideation, I'm worried about his risk factors for suicide. I placed him under involuntary commitment so he may be evaluated by psychiatrist.  Patient care transferred to Dr. Carollee Massed at 3:45pm.  EKG, chest x-ray, troponin, urinalysis, urine drug screen, and psychiatric evaluation are all pending  Patient / Family / Caregiver informed of clinical course, medical decision-making process, and agree with plan.    ___________________________________________   FINAL CLINICAL IMPRESSION(S) / ED DIAGNOSES   Final diagnoses:  Suicidal ideation  Depression  Panic attack       Governor Rooks, MD 11/17/14 1544

## 2014-11-17 NOTE — ED Provider Notes (Signed)
-----------------------------------------   6:31 PM on 11/17/2014 -----------------------------------------  This patient was initially seen by Dr. Governor Rooksebecca Lord. Please see her H&P for further detail.  I reviewed her documentation.  The patient is been seen by Dr. Guss Bundehalla, psychiatry. Dr. Guss Bundehalla finds the patient not to be suicidal or dangerous to self. He does have a history of depression and anxiety. Please see her consultation note for additional information.  [place] has removed the involuntary commitment order. The patient be discharged in the emergency department. [facility]  Final diagnosis:  Depression Anxiety Panic attack  Darien Ramusavid W Latanja Lehenbauer, MD 11/17/14 [phone]

## 2014-11-17 NOTE — ED Notes (Signed)
Se[facility]. Put back on zoloft 50 mg titrate to 100mg . Went off his burparone a few weeks ago on his. Did not take his meds today. Brought in by wife.

## 2014-11-17 NOTE — BHH Counselor (Signed)
TTS was completing "roundings" with Dr. Guss Bundehalla. TTS saw Pt with Dr. Guss Bundehalla, who is discharging the Pt.  TTS unable to complete assessment.   Anne ShutterHannah Monserat Prestigiacomo, LPCA Therapeutic Triage Specialist

## 2014-11-17 NOTE — ED Notes (Addendum)
States is worried about dying and with his multiple physical problems and loss of job is feeling hopeless but would never kill himself. Has appt tues for surgery on his knee and then back tracks and says everything is hopeless and cant see an out to all his problems. Nurse staying with patient throughout until a sitter can be sent by charge nurse.

## 2014-11-17 NOTE — ED Notes (Signed)
[phone] Gentry FitzCarren Gatton- Pt's wife.

## 2014-11-17 NOTE — ED Notes (Signed)
Wife to p/u pt

## 2014-11-22 ENCOUNTER — Encounter: Payer: Self-pay | Admitting: Emergency Medicine

## 2014-11-22 ENCOUNTER — Emergency Department
Admission: EM | Admit: 2014-11-22 | Discharge: 2014-11-23 | Disposition: A | Discharge status: Home / Self Care | Payer: Self-pay | Attending: Emergency Medicine | Admitting: Emergency Medicine

## 2014-11-22 DIAGNOSIS — L03115 Cellulitis of right lower limb: Secondary | ICD-10-CM | POA: Insufficient documentation

## 2014-11-22 DIAGNOSIS — Z91040 Latex allergy status: Secondary | ICD-10-CM | POA: Insufficient documentation

## 2014-11-22 DIAGNOSIS — Z79899 Other long term (current) drug therapy: Secondary | ICD-10-CM | POA: Insufficient documentation

## 2014-11-22 DIAGNOSIS — Z880 Allergy status to penicillin: Secondary | ICD-10-CM | POA: Insufficient documentation

## 2014-11-22 DIAGNOSIS — M7989 Other specified soft tissue disorders: Secondary | ICD-10-CM

## 2014-11-22 DIAGNOSIS — Z7982 Long term (current) use of aspirin: Secondary | ICD-10-CM | POA: Insufficient documentation

## 2014-11-22 LAB — CBC WITH DIFFERENTIAL/PLATELET
BASOS ABS: 0 10*3/uL (ref 0–0.1)
Basophils Relative: 0 %
Eosinophils Absolute: 0.1 10*3/uL (ref 0–0.7)
Eosinophils Relative: 1 %
HEMATOCRIT: 39.2 % — AB (ref 40.0–52.0)
HEMOGLOBIN: 13.1 g/dL (ref 13.0–18.0)
LYMPHS PCT: 15 %
Lymphs Abs: 1.7 10*3/uL (ref 1.0–3.6)
MCH: 30.8 pg (ref 26.0–34.0)
MCHC: 33.4 g/dL (ref 32.0–36.0)
MCV: 92.2 fL (ref 80.0–100.0)
Monocytes Absolute: 0.8 10*3/uL (ref 0.2–1.0)
Monocytes Relative: 7 %
NEUTROS ABS: 8.9 10*3/uL — AB (ref 1.4–6.5)
NEUTROS PCT: 77 %
Platelets: 146 10*3/uL — ABNORMAL LOW (ref 150–440)
RBC: 4.26 MIL/uL — AB (ref 4.40–5.90)
RDW: 13.5 % (ref 11.5–14.5)
WBC: 11.5 10*3/uL — AB (ref 3.8–10.6)

## 2014-11-22 NOTE — ED Notes (Signed)
Pt. Had [facility] on Tuesday the 25th of this month.  Pt. Was released [facility] today.  Pt. Has increased swelling of the entire rt. Leg.

## 2014-11-22 NOTE — ED Notes (Signed)
[facility]. [facility]e Gill[facility] Scott pager [phone])  Contact # to [facility] [phone]

## 2014-11-22 NOTE — ED Notes (Signed)
Per DR. Mayford KnifeWilliams, ok to take 0.5 mg ativan of patient's home prescription.

## 2014-11-22 NOTE — ED Notes (Signed)
Pt. Has swelling and redness to area.

## 2014-11-23 ENCOUNTER — Emergency Department: Payer: Self-pay

## 2014-11-23 LAB — BASIC METABOLIC PANEL
Anion gap: 6 (ref 5–15)
BUN: 11 mg/dL (ref 6–20)
CALCIUM: 9.1 mg/dL (ref 8.9–10.3)
CO2: 31 mmol/L (ref 22–32)
CREATININE: 0.94 mg/dL (ref 0.61–1.24)
Chloride: 97 mmol/L — ABNORMAL LOW (ref 101–111)
GFR calc Af Amer: >60 mL/min (ref >60)
GFR calc non Af Amer: >60 mL/min (ref >60)
GLUCOSE: 121 mg/dL — AB (ref 65–99)
Potassium: 3.4 mmol/L — ABNORMAL LOW (ref 3.5–5.1)
Sodium: 134 mmol/L — ABNORMAL LOW (ref 135–145)

## 2014-11-23 MED ORDER — CEPHALEXIN 500 MG PO CAPS
500.0000 mg | ORAL_CAPSULE | Freq: Once | ORAL | Status: AC
  Administered 2014-11-23: 500 mg via ORAL
  Filled 2014-11-23: qty 1

## 2014-11-23 MED ORDER — SULFAMETHOXAZOLE-TRIMETHOPRIM 800-160 MG PO TABS
1.0000 | ORAL_TABLET | Freq: Once | ORAL | Status: AC
  Administered 2014-11-23: 1 via ORAL
  Filled 2014-11-23: qty 1

## 2014-11-23 MED ORDER — OXYCODONE-ACETAMINOPHEN 5-325 MG PO TABS
1.0000 | ORAL_TABLET | Freq: Once | ORAL | Status: AC
Start: 2014-11-23 — End: 2014-11-23
  Administered 2014-11-23: 1 via ORAL
  Filled 2014-11-23: qty 1

## 2014-11-23 MED ORDER — CEPHALEXIN 500 MG PO CAPS: 500.0000 mg | ORAL_CAPSULE | Freq: Three times a day (TID) | ORAL | Status: DC

## 2014-11-23 MED ORDER — SULFAMETHOXAZOLE-TRIMETHOPRIM 800-160 MG PO TABS: 1.0000 | ORAL_TABLET | Freq: Two times a day (BID) | ORAL | Status: DC

## 2014-11-23 NOTE — ED Provider Notes (Signed)
[facility] Emergency Department Provider Note  ____________________________________________  Time seen: 12:30 AM  I have reviewed the triage vital signs and the nursing notes.   HISTORY  Chief Complaint Knee Pain    HPI Steven Madden is a 59 y.o. male who is 3 days postop from right knee arthroplasty at the [facility],.  He has had worsening swelling and redness and warmth to the right leg from the thigh down to the mid shin over these past 2-3 days. No fever chills chest pain shortness of breath. Eating and drinking normally. Taking medications. No new trauma or other injuries.     Past Medical History  Diagnosis Date  . Anxiety     takes Zoloft  . Mental disorder     panic disorder . Rarely takes Xanax  . Arthritis     knees  . Chronic kidney disease     Rt UPJ stone  . Pneumonia     age 24 yr old  . Depression      There are no active problems to display for this patient.    Past Surgical History  Procedure Laterality Date  . Shoulder arthroscopy distal clavicle excision and open rotator cuff repair  8 yrs ago    Right shoulder  . Wrist arthroscopy  23 yr ago  . Joint replacement      lt knee replacement x 2  . Total knee left  last done 8 yrs ago    x 2  . Knee arthroscopy  yrs ago    left knee  . Cystoscopy w/ ureteral stent placement  07/20/2011    Procedure: CYSTOSCOPY WITH RETROGRADE PYELOGRAM/URETERAL STENT PLACEMENT;  Surgeon: Kathi Ludwig, MD;  Location: [facility];  Service: Urology;  Laterality: Right;  . Right knee arthroscopy  dec 2012 last done    x 2     Current Outpatient Rx  Name  Route  Sig  Dispense  Refill  . ALPRAZolam (XANAX) 0.25 MG tablet   Oral   Take 1 tablet by mouth 2 (two) times daily as needed.      5   . aspirin EC 81 MG tablet   Oral   Take 81 mg by mouth daily.         . clotrimazole (LOTRIMIN) 1 % cream   Topical   Apply 1 application topically 2 (two) times  daily.         Marland Kitchen HYDROcodone-acetaminophen (NORCO) 7.5-325 MG per tablet   Oral   Take 1 tablet by mouth every 6 (six) hours as needed. Pain         . LISINOPRIL-HYDROCHLOROTHIAZIDE PO   Oral   Take 1 tablet by mouth daily.         . sertraline (ZOLOFT) 50 MG tablet   Oral   Take 50 mg by mouth daily with breakfast.          . tetrahydrozoline-zinc (VISINE-AC) 0.05-0.25 % ophthalmic solution   Both Eyes   Place 2 drops into both eyes as needed. Itchy eyes         . cephALEXin (KEFLEX) 500 MG capsule   Oral   Take 1 capsule (500 mg total) by mouth 3 (three) times daily.   21 capsule   0   . sulfamethoxazole-trimethoprim (BACTRIM DS) 800-160 MG tablet   Oral   Take 1 tablet by mouth 2 (two) times daily.   14 tablet   0  Allergies Meloxicam; Pseudoephedrine hcl; Latex; and Penicillins   No family history on file.  Social History Social History  Substance Use Topics  . Smoking status: Never Smoker   . Smokeless tobacco: Never Used  . Alcohol Use: No     Comment: very rare    Review of Systems  Constitutional:   No fever or chills. No weight changes Eyes:   No blurry vision or double vision.  ENT:   No sore throat. Cardiovascular:   No chest pain. Respiratory:   No dyspnea or cough. Gastrointestinal:   Negative for abdominal pain, vomiting and diarrhea.  No BRBPR or melena. Genitourinary:   Negative for dysuria, urinary retention, bloody urine, or difficulty urinating. Musculoskeletal:   Right leg pain and swelling as above Skin:   Negative for rash. Neurological:   Negative for headaches, focal weakness or numbness. Psychiatric:  No anxiety or depression.   Endocrine:  No hot/cold intolerance, changes in energy, or sleep difficulty.  10-point ROS otherwise negative.  ____________________________________________   PHYSICAL EXAM:  VITAL SIGNS: ED Triage Vitals  Enc Vitals Group     BP 11/22/14 2150 146/81 mmHg     Pulse Rate 11/22/14  2150 91     Resp 11/22/14 2150 18     Temp 11/22/14 2150 97.7 F (36.5 C)     Temp Source 11/22/14 2150 Oral     SpO2 11/22/14 2150 98 %     Weight 11/22/14 2150 220 lb (99.791 kg)     Height 11/22/14 2150  (1.803 m)     Head Cir --      Peak Flow --      Pain Score 11/22/14 2202 7     Pain Loc --      Pain Edu? --      Excl. in GC? --      Constitutional:   Alert and oriented. Well appearing and in no distress. Eyes:   No scleral icterus. No conjunctival pallor. PERRL. EOMI ENT   Head:   Normocephalic and atraumatic.   Nose:   No congestion/rhinnorhea. No septal hematoma   Mouth/Throat:   MMM, no pharyngeal erythema. No peritonsillar mass. No uvula shift.   Neck:   No stridor. No SubQ emphysema. No meningismus. Hematological/Lymphatic/Immunilogical:   No cervical lymphadenopathy. Cardiovascular:   RRR. Normal and symmetric distal pulses are present in all extremities. No murmurs, rubs, or gallops. Respiratory:   Normal respiratory effort without tachypnea nor retractions. Breath sounds are clear and equal bilaterally. No wheezes/rales/rhonchi. Gastrointestinal:   Soft and nontender. No distention. There is no CVA tenderness.  No rebound, rigidity, or guarding. Genitourinary:   deferred Musculoskeletal:   Swelling of the right leg from the distal thigh to proximal shin with trace pitting edema. The area is warm to the touch and faintly erythematous diffusely. No bony tenderness, no joint effusion, intact range of motion of the knee. Surgical wound is stapled and with the initial surgical dressing in place. It is dry without using, hemostatic, not indurated or dehiscing or inflamed. Neurologic:   Normal speech and language.  CN 2-10 normal. Motor grossly intact. No pronator drift.  Normal gait. No gross focal neurologic deficits are appreciated.  Skin:    Skin is warm, dry and intact. No rash noted.  No petechiae, purpura, or bullae. Surgical incision as  above Psychiatric:   Mood and affect are normal. Speech and behavior are normal. Patient exhibits appropriate insight and judgment.  ____________________________________________    LABS (  pertinent positives/negatives) (all labs ordered are listed, but only abnormal results are displayed) Labs Reviewed  CBC WITH DIFFERENTIAL/PLATELET - Abnormal; Notable for the following:    WBC 11.5 (*)    RBC 4.26 (*)    HCT 39.2 (*)    Platelets 146 (*)    Neutro Abs 8.9 (*)    All other components within normal limits  BASIC METABOLIC PANEL - Abnormal; Notable for the following:    Sodium 134 (*)    Potassium 3.4 (*)    Chloride 97 (*)    Glucose, Bld 121 (*)    All other components within normal limits   ____________________________________________   EKG    ____________________________________________    RADIOLOGY  Ultrasound right lower extremity negative for DVT  ____________________________________________   PROCEDURES   ____________________________________________   INITIAL IMPRESSION / ASSESSMENT AND PLAN / ED COURSE  Pertinent labs & imaging results that were available during my care of the patient were reviewed by me and considered in my medical decision making (see chart for details).  Patient presents with inflammatory changes of the right leg concerning for cellulitis versus DVT. Ultrasound is negative. We'll treated with Keflex and Bactrim for presumed cellulitis of the right leg. Low suspicion for septic arthritis, no evidence of abscess formation, and the surgical incision looks clean and intact. After taking down the dressing to examine the knee and the wound, I immediately clean the area with chlorhexidine swab and then redress the wound with Xeroform, dry gauze, and sterile occlusive Tegaderm dressing. No sepsis, no necrotizing fasciitis or osteomyelitis or abscess. Patient is well-appearing, pain controlled,  nontoxic     ____________________________________________   FINAL CLINICAL IMPRESSION(S) / ED DIAGNOSES  Final diagnoses:  Right leg swelling  Cellulitis of right lower extremity      Sharman CheekPhillip Gratia Disla, MD 11/23/14 [phone]

## 2014-11-23 NOTE — Discharge Instructions (Signed)
Cellulitis °Cellulitis is an infection of the skin and the tissue beneath it. The infected area is usually red and tender. Cellulitis occurs most often in the arms and lower legs.  °CAUSES  °Cellulitis is caused by bacteria that enter the skin through cracks or cuts in the skin. The most common types of bacteria that cause cellulitis are staphylococci and streptococci. °SIGNS AND SYMPTOMS  °· Redness and warmth. °· Swelling. °· Tenderness or pain. °· Fever. °DIAGNOSIS  °Your health care provider can usually determine what is wrong based on a physical exam. Blood tests may also be done. °TREATMENT  °Treatment usually involves taking an antibiotic medicine. °HOME CARE INSTRUCTIONS  °· Take your antibiotic medicine as directed by your health care provider. Finish the antibiotic even if you start to feel better. °· Keep the infected arm or leg elevated to reduce swelling. °· Apply a warm cloth to the affected area up to 4 times per day to relieve pain. °· Take medicines only as directed by your health care provider. °· Keep all follow-up visits as directed by your health care provider. °SEEK MEDICAL CARE IF:  °· You notice red streaks coming from the infected area. °· Your red area gets larger or turns dark in color. °· Your bone or joint underneath the infected area becomes painful after the skin has healed. °· Your infection returns in the same area or another area. °· You notice a swollen bump in the infected area. °· You develop new symptoms. °· You have a fever. °SEEK IMMEDIATE MEDICAL CARE IF:  °· You feel very sleepy. °· You develop vomiting or diarrhea. °· You have a general ill feeling (malaise) with muscle aches and pains. °  °This information is not intended to replace advice given to you by your health care provider. Make sure you discuss any questions you have with your health care provider. °  °Document Released: 10/21/2004 Document Revised: 10/02/2014 Document Reviewed: 03/29/2011 °Elsevier Interactive  Patient Education ©2016 [employer] ° °Edema °Edema is an abnormal buildup of fluids in your body tissues. Edema is somewhat dependent on gravity to pull the fluid to the lowest place in your body. That makes the condition more common in the legs and thighs (lower extremities). Painless swelling of the feet and ankles is common and becomes more likely as you get older. It is also common in looser tissues, like around your eyes.  °When the affected area is squeezed, the fluid may move out of that spot and leave a dent for a few moments. This dent is called pitting.  °CAUSES  °There are many possible causes of edema. Eating too much salt and being on your feet or sitting for a long time can cause edema in your legs and ankles. Hot weather may make edema worse. Common medical causes of edema include: °· Heart failure. °· Liver disease. °· Kidney disease. °· Weak blood vessels in your legs. °· Cancer. °· An injury. °· Pregnancy. °· Some medications. °· Obesity.  °SYMPTOMS  °Edema is usually painless. Your skin may look swollen or shiny.  °DIAGNOSIS  °Your health care provider may be able to diagnose edema by asking about your medical history and doing a physical exam. You may need to have tests such as X-rays, an electrocardiogram, or blood tests to check for medical conditions that may cause edema.  °TREATMENT  °Edema treatment depends on the cause. If you have heart, liver, or kidney disease, you need the treatment appropriate for these conditions. General treatment may include: °·   Elevation of the affected body part above the level of your heart. °· Compression of the affected body part. Pressure from elastic bandages or support stockings squeezes the tissues and forces fluid back into the blood vessels. This keeps fluid from entering the tissues. °· Restriction of fluid and salt intake. °· Use of a water pill (diuretic). These medications are appropriate only for some types of edema. They pull fluid out of your  body and make you urinate more often. This gets rid of fluid and reduces swelling, but diuretics can have side effects. Only use diuretics as directed by your health care provider. °HOME CARE INSTRUCTIONS  °· Keep the affected body part above the level of your heart when you are lying down.   °· Do not sit still or stand for prolonged periods.   °· Do not put anything directly under your knees when lying down. °· Do not wear constricting clothing or garters on your upper legs.   °· Exercise your legs to work the fluid back into your blood vessels. This may help the swelling go down.   °· Wear elastic bandages or support stockings to reduce ankle swelling as directed by your health care provider.   °· Eat a low-salt diet to reduce fluid if your health care provider recommends it.   °· Only take medicines as directed by your health care provider.  °SEEK MEDICAL CARE IF:  °· Your edema is not responding to treatment. °· You have heart, liver, or kidney disease and notice symptoms of edema. °· You have edema in your legs that does not improve after elevating them.   °· You have sudden and unexplained weight gain. °SEEK IMMEDIATE MEDICAL CARE IF:  °· You develop shortness of breath or chest pain.   °· You cannot breathe when you lie down. °· You develop pain, redness, or warmth in the swollen areas.   °· You have heart, liver, or kidney disease and suddenly get edema. °· You have a fever and your symptoms suddenly get worse. °MAKE SURE YOU:  °· Understand these instructions. °· Will watch your condition. °· Will get help right away if you are not doing well or get worse. °  °This information is not intended to replace advice given to you by your health care provider. Make sure you discuss any questions you have with your health care provider. °  °Document Released: 01/11/2005 Document Revised: 02/01/2014 Document Reviewed: 11/03/2012 °Elsevier Interactive Patient Education ©2016 [employer] Inc. ° °

## 2014-12-10 ENCOUNTER — Emergency Department
Admission: EM | Admit: 2014-12-10 | Discharge: 2014-12-10 | Disposition: A | Discharge status: Home / Self Care | Payer: Self-pay | Attending: Emergency Medicine | Admitting: Emergency Medicine

## 2014-12-10 ENCOUNTER — Encounter: Payer: Self-pay | Admitting: Emergency Medicine

## 2014-12-10 ENCOUNTER — Emergency Department: Payer: Self-pay

## 2014-12-10 DIAGNOSIS — R300 Dysuria: Secondary | ICD-10-CM | POA: Insufficient documentation

## 2014-12-10 DIAGNOSIS — K859 Acute pancreatitis without necrosis or infection, unspecified: Secondary | ICD-10-CM | POA: Insufficient documentation

## 2014-12-10 DIAGNOSIS — I129 Hypertensive chronic kidney disease with stage 1 through stage 4 chronic kidney disease, or unspecified chronic kidney disease: Secondary | ICD-10-CM | POA: Insufficient documentation

## 2014-12-10 DIAGNOSIS — N189 Chronic kidney disease, unspecified: Secondary | ICD-10-CM | POA: Insufficient documentation

## 2014-12-10 DIAGNOSIS — Z79899 Other long term (current) drug therapy: Secondary | ICD-10-CM | POA: Insufficient documentation

## 2014-12-10 DIAGNOSIS — Z91040 Latex allergy status: Secondary | ICD-10-CM | POA: Insufficient documentation

## 2014-12-10 DIAGNOSIS — Z7982 Long term (current) use of aspirin: Secondary | ICD-10-CM | POA: Insufficient documentation

## 2014-12-10 DIAGNOSIS — Z792 Long term (current) use of antibiotics: Secondary | ICD-10-CM | POA: Insufficient documentation

## 2014-12-10 DIAGNOSIS — F419 Anxiety disorder, unspecified: Secondary | ICD-10-CM | POA: Insufficient documentation

## 2014-12-10 DIAGNOSIS — R509 Fever, unspecified: Secondary | ICD-10-CM | POA: Insufficient documentation

## 2014-12-10 HISTORY — DX: Essential (primary) hypertension: I10

## 2014-12-10 LAB — URINALYSIS COMPLETE WITH MICROSCOPIC (ARMC ONLY)
BILIRUBIN URINE: NEGATIVE
Glucose, UA: NEGATIVE mg/dL
Hgb urine dipstick: NEGATIVE
KETONES UR: NEGATIVE mg/dL
LEUKOCYTES UA: NEGATIVE
NITRITE: NEGATIVE
PH: 6 (ref 5.0–8.0)
Protein, ur: NEGATIVE mg/dL
SPECIFIC GRAVITY, URINE: 1.018 (ref 1.005–1.030)
Squamous Epithelial / LPF: NONE SEEN

## 2014-12-10 LAB — CBC
HEMATOCRIT: 38.6 % — AB (ref 40.0–52.0)
HEMOGLOBIN: 13.2 g/dL (ref 13.0–18.0)
MCH: 30.9 pg (ref 26.0–34.0)
MCHC: 34.3 g/dL (ref 32.0–36.0)
MCV: 90.2 fL (ref 80.0–100.0)
Platelets: 297 10*3/uL (ref 150–440)
RBC: 4.28 MIL/uL — AB (ref 4.40–5.90)
RDW: 13 % (ref 11.5–14.5)
WBC: 5.4 10*3/uL (ref 3.8–10.6)

## 2014-12-10 LAB — COMPREHENSIVE METABOLIC PANEL
ALBUMIN: 3.7 g/dL (ref 3.5–5.0)
ALK PHOS: 94 U/L (ref 38–126)
ALT: 16 U/L — ABNORMAL LOW (ref 17–63)
ANION GAP: 7 (ref 5–15)
AST: 19 U/L (ref 15–41)
BILIRUBIN TOTAL: 0.5 mg/dL (ref 0.3–1.2)
BUN: 10 mg/dL (ref 6–20)
CALCIUM: 9.6 mg/dL (ref 8.9–10.3)
CO2: 27 mmol/L (ref 22–32)
Chloride: 106 mmol/L (ref 101–111)
Creatinine, Ser: 0.94 mg/dL (ref 0.61–1.24)
Glucose, Bld: 99 mg/dL (ref 65–99)
POTASSIUM: 3.8 mmol/L (ref 3.5–5.1)
Sodium: 140 mmol/L (ref 135–145)
TOTAL PROTEIN: 7 g/dL (ref 6.5–8.1)

## 2014-12-10 LAB — LIPASE, BLOOD: Lipase: 99 U/L — ABNORMAL HIGH (ref 11–51)

## 2014-12-10 MED ORDER — SODIUM CHLORIDE 0.9 % IV BOLUS (SEPSIS)
1000.0000 mL | Freq: Once | INTRAVENOUS | Status: AC
  Administered 2014-12-10: 1000 mL via INTRAVENOUS

## 2014-12-10 MED ORDER — IOHEXOL 240 MG/ML SOLN
25.0000 mL | Freq: Once | INTRAMUSCULAR | Status: AC | PRN
  Administered 2014-12-10: 25 mL via ORAL
  Filled 2014-12-10: qty 25

## 2014-12-10 MED ORDER — ONDANSETRON HCL 4 MG PO TABS: 4.0000 mg | ORAL_TABLET | Freq: Three times a day (TID) | ORAL | Status: DC | PRN

## 2014-12-10 MED ORDER — TRAMADOL HCL 50 MG PO TABS
100.0000 mg | ORAL_TABLET | Freq: Once | ORAL | Status: AC
  Administered 2014-12-10: 100 mg via ORAL
  Filled 2014-12-10: qty 2

## 2014-12-10 MED ORDER — ONDANSETRON HCL 4 MG/2ML IJ SOLN
4.0000 mg | Freq: Once | INTRAMUSCULAR | Status: AC
  Administered 2014-12-10: 4 mg via INTRAVENOUS
  Filled 2014-12-10: qty 2

## 2014-12-10 MED ORDER — IOHEXOL 350 MG/ML SOLN
100.0000 mL | Freq: Once | INTRAVENOUS | Status: AC | PRN
  Administered 2014-12-10: 100 mL via INTRAVENOUS
  Filled 2014-12-10: qty 100

## 2014-12-10 NOTE — Discharge Instructions (Signed)
Return to the emergency department for any worsening condition including fever, vomiting, black or bloody stools, any worsening pain, or any other symptoms concerning to you.  A quick diet for 3-4 days, then workup to soft diet, and then workup to regular diet as tolerated.   Acute Pancreatitis Acute pancreatitis is a disease in which the pancreas becomes suddenly inflamed. The pancreas is a large gland located behind your stomach. The pancreas produces enzymes that help digest food. The pancreas also releases the hormones glucagon and insulin that help regulate blood sugar. Damage to the pancreas occurs when the digestive enzymes from the pancreas are activated and begin attacking the pancreas before being released into the intestine. Most acute attacks last a couple of days and can cause serious complications. Some people become dehydrated and develop low blood pressure. In severe cases, bleeding into the pancreas can lead to shock and can be life-threatening. The lungs, heart, and kidneys may fail. CAUSES  Pancreatitis can happen to anyone. In some cases, the cause is unknown. Most cases are caused by:  Alcohol abuse.  Gallstones. Other less common causes are:  Certain medicines.  Exposure to certain chemicals.  Infection.  Damage caused by an accident (trauma).  Abdominal surgery. SYMPTOMS   Pain in the upper abdomen that may radiate to the back.  Tenderness and swelling of the abdomen.  Nausea and vomiting. DIAGNOSIS  Your caregiver will perform a physical exam. Blood and stool tests may be done to confirm the diagnosis. Imaging tests may also be done, such as X-rays, CT scans, or an ultrasound of the abdomen. TREATMENT  Treatment usually requires a stay in the hospital. Treatment may include:  Pain medicine.  Fluid replacement through an intravenous line (IV).  Placing a tube in the stomach to remove stomach contents and control vomiting.  Not eating for 3 or 4 days.  This gives your pancreas a rest, because enzymes are not being produced that can cause further damage.  Antibiotic medicines if your condition is caused by an infection.  Surgery of the pancreas or gallbladder. HOME CARE INSTRUCTIONS   Follow the diet advised by your caregiver. This may involve avoiding alcohol and decreasing the amount of fat in your diet.  Eat smaller, more frequent meals. This reduces the amount of digestive juices the pancreas produces.  Drink enough fluids to keep your urine clear or pale yellow.  Only take over-the-counter or prescription medicines as directed by your caregiver.  Avoid drinking alcohol if it caused your condition.  Do not smoke.  Get plenty of rest.  Check your blood sugar at home as directed by your caregiver.  Keep all follow-up appointments as directed by your caregiver. SEEK MEDICAL CARE IF:   You do not recover as quickly as expected.  You develop new or worsening symptoms.  You have persistent pain, weakness, or nausea.  You recover and then have another episode of pain. SEEK IMMEDIATE MEDICAL CARE IF:   You are unable to eat or keep fluids down.  Your pain becomes severe.  You have a fever or persistent symptoms for more than 2 to 3 days.  You have a fever and your symptoms suddenly get worse.  Your skin or the white part of your eyes turn yellow (jaundice).  You develop vomiting.  You feel dizzy, or you faint.  Your blood sugar is high (over 300 mg/dL). MAKE SURE YOU:   Understand these instructions.  Will watch your condition.  Will get help right away  if you are not doing well or get worse.   This information is not intended to replace advice given to you by your health care provider. Make sure you discuss any questions you have with your health care provider.   Document Released: 01/11/2005 Document Revised: 07/13/2011 Document Reviewed: 04/22/2011 Elsevier Interactive Patient Education [employer].

## 2014-12-10 NOTE — ED Notes (Signed)
Says today is nauseated.  Has been takiong tramadlol, aspirin and acentaminophen for pain.

## 2014-12-10 NOTE — ED Provider Notes (Signed)
[facility] Emergency Department Provider Note   ____________________________________________  Time seen:  I have reviewed the triage vital signs and the triage nursing note.  HISTORY  Chief Complaint Fever; Generalized Body Aches; Nausea; and Abdominal Pain   Historian Patient and spouse  HPI Steven Madden is a 59 y.o. male who is here for evaluation of abdominal pain, right flank pain, body aches, and low grade fevers, as well as nausea. Patient states he hasn't felt well since he had a knee replacement on 11/19/14 at the [facility]. Patient subsequently developed a cellulitis that was treated starting 12/24/14. That has improved, however he is still felt generalized nausea, as well as intermittent abdominal pains. No exacerbating or alleviating factors for the abdominal discomfort. Over the past several days he has noted dark urine and possibly cloudy urine. He did have a kidney stone in the past, and he is wondering at the right flank pain might be related to kidney stone. No problems with vomiting, diarrhea, constipation, however he does have nausea and epigastric discomfort at times.    Past Medical History  Diagnosis Date  . Anxiety     takes Zoloft  . Mental disorder     panic disorder . Rarely takes Xanax  . Arthritis     knees  . Chronic kidney disease     Rt UPJ stone  . Pneumonia     age 5 yr old  . Depression   . Hypertension     There are no active problems to display for this patient.   Past Surgical History  Procedure Laterality Date  . Shoulder arthroscopy distal clavicle excision and open rotator cuff repair  8 yrs ago    Right shoulder  . Wrist arthroscopy  23 yr ago  . Joint replacement      lt knee replacement x 2  . Total knee left  last done 8 yrs ago    x 2  . Knee arthroscopy  yrs ago    left knee  . Cystoscopy w/ ureteral stent placement  07/20/2011    Procedure: CYSTOSCOPY WITH RETROGRADE PYELOGRAM/URETERAL STENT  PLACEMENT;  Surgeon: Kathi Ludwig, MD;  Location: [facility];  Service: Urology;  Laterality: Right;  . Right knee arthroscopy  dec 2012 last done    x 2    Current Outpatient Rx  Name  Route  Sig  Dispense  Refill  . ALPRAZolam (XANAX) 0.25 MG tablet   Oral   Take 1 tablet by mouth 2 (two) times daily as needed.      5   . aspirin EC 81 MG tablet   Oral   Take 81 mg by mouth daily.         . cephALEXin (KEFLEX) 500 MG capsule   Oral   Take 1 capsule (500 mg total) by mouth 3 (three) times daily.   21 capsule   0   . clotrimazole (LOTRIMIN) 1 % cream   Topical   Apply 1 application topically 2 (two) times daily.         Marland Kitchen HYDROcodone-acetaminophen (NORCO) 7.5-325 MG per tablet   Oral   Take 1 tablet by mouth every 6 (six) hours as needed. Pain         . LISINOPRIL-HYDROCHLOROTHIAZIDE PO   Oral   Take 1 tablet by mouth daily.         . ondansetron (ZOFRAN) 4 MG tablet   Oral   Take 1 tablet (  4 mg total) by mouth every 8 (eight) hours as needed for nausea or vomiting.   10 tablet   0   . sertraline (ZOLOFT) 50 MG tablet   Oral   Take 50 mg by mouth daily with breakfast.          . sulfamethoxazole-trimethoprim (BACTRIM DS) 800-160 MG tablet   Oral   Take 1 tablet by mouth 2 (two) times daily.   14 tablet   0   . tetrahydrozoline-zinc (VISINE-AC) 0.05-0.25 % ophthalmic solution   Both Eyes   Place 2 drops into both eyes as needed. Itchy eyes           Allergies Meloxicam; Pseudoephedrine hcl; Latex; and Penicillins  History reviewed. No pertinent family history.  Social History Social History  Substance Use Topics  . Smoking status: Never Smoker   . Smokeless tobacco: Never Used  . Alcohol Use: No     Comment: very rare    Review of Systems  Constitutional: Subjective and low-grade fevers approximately 99 per family Eyes: Negative for visual changes. ENT: Negative for sore throat. Cardiovascular: Negative for  chest pain. Respiratory: Negative for shortness of breath. Gastrointestinal: Positive for abdominal pain. Genitourinary: Positive for dysuria. Musculoskeletal: Negative for back pain. Skin: Negative for rash. Neurological: Negative for headache. Positive for anxiety. 10 point Review of Systems otherwise negative ____________________________________________   PHYSICAL EXAM:  VITAL SIGNS: ED Triage Vitals  Enc Vitals Group     BP 12/10/14 1256 136/88 mmHg     Pulse Rate 12/10/14 1256 91     Resp 12/10/14 1256 18     Temp 12/10/14 1256 98.4 F (36.9 C)     Temp Source 12/10/14 1256 Oral     SpO2 12/10/14 1256 96 %     Weight 12/10/14 1256 220 lb (99.791 kg)     Height 12/10/14 1256  (1.803 m)     Head Cir --      Peak Flow --      Pain Score 12/10/14 1256 8     Pain Loc --      Pain Edu? --      Excl. in GC? --      Constitutional: Alert and oriented. Well appearing and in no distress. Eyes: Conjunctivae are normal. PERRL. Normal extraocular movements. ENT   Head: Normocephalic and atraumatic.   Nose: No congestion/rhinnorhea.   Mouth/Throat: Mucous membranes are moist.   Neck: No stridor. Cardiovascular/Chest: Normal rate, regular rhythm.  No murmurs, rubs, or gallops. Respiratory: Normal respiratory effort without tachypnea nor retractions. Breath sounds are clear and equal bilaterally. No wheezes/rales/rhonchi. Gastrointestinal: Soft. No distention, no guarding, no rebound. Mild to moderate tenderness in the epigastrium and left upper quadrant.  Genitourinary/rectal:Deferred Musculoskeletal: Nontender with normal range of motion in all extremities. No joint effusions.  No lower extremity tenderness.  No edema. Neurologic:  Normal speech and language. No gross or focal neurologic deficits are appreciated. Skin:  Skin is warm, dry and intact. No rash noted. Psychiatric: Slightly depressed mood, but no suicidal ideation. Speech and behavior are normal.  Patient exhibits appropriate insight and judgment.  ____________________________________________   EKG I, Governor Rooks, MD, the attending physician have personally viewed and interpreted all ECGs.  No EKG performed ____________________________________________  LABS (pertinent positives/negatives)  Lipase 99 Comprehensive metabolic panel without significant abnormality CBC shows white blood cell count 5.4, hemoglobin 13.2 and platelet count 297 Urinalysis significant for rare bacteria and otherwise negative  ____________________________________________  RADIOLOGY All Xrays  were viewed by me. Imaging interpreted by Radiologist.  CT abdomen and pelvis:  IMPRESSION: 1. No acute abnormality seen within the abdomen or pelvis. 2. Minimal diverticulosis along the proximal sigmoid colon, without evidence of diverticulitis. __________________________________________  PROCEDURES  Procedure(s) performed: None  Critical Care performed: None  ____________________________________________   ED COURSE / ASSESSMENT AND PLAN  CONSULTATIONS: None  Pertinent labs & imaging results that were available during my care of the patient were reviewed by me and considered in my medical decision making (see chart for details).   On history patient seems to think is having some urinary symptoms and possibly right flank pain, in addition to some epigastric and left upper quadrant pain which is found on exam. His laboratory evaluation, his lipase was elevated. We discussed the diagnosis of acute pancreatitis. We discussed dietary medications. We discussed CT scan which was reassuring.  Patient will follow up as an outpatient with premature physician.  Patient / Family / Caregiver informed of clinical course, medical decision-making process, and agree with plan.   I discussed return precautions, follow-up instructions, and discharged instructions with patient and/or  family.  ___________________________________________   FINAL CLINICAL IMPRESSION(S) / ED DIAGNOSES   Final diagnoses:  Acute pancreatitis, unspecified pancreatitis type       Governor Rooksebecca Stormy Connon, MD 12/10/14 [id number]

## 2014-12-10 NOTE — ED Notes (Signed)
Pt to ed with c/o fever, body aches, right side abd pain and nausea for 5 days.

## 2015-01-22 ENCOUNTER — Encounter: Payer: Self-pay | Admitting: Medical Oncology

## 2015-01-22 ENCOUNTER — Emergency Department: Payer: Non-veteran care

## 2015-01-22 ENCOUNTER — Emergency Department
Admission: EM | Admit: 2015-01-22 | Discharge: 2015-01-22 | Disposition: A | Discharge status: Home / Self Care | Payer: Non-veteran care | Attending: Emergency Medicine | Admitting: Emergency Medicine

## 2015-01-22 DIAGNOSIS — N189 Chronic kidney disease, unspecified: Secondary | ICD-10-CM | POA: Insufficient documentation

## 2015-01-22 DIAGNOSIS — F410 Panic disorder [episodic paroxysmal anxiety] without agoraphobia: Secondary | ICD-10-CM | POA: Insufficient documentation

## 2015-01-22 DIAGNOSIS — Z91040 Latex allergy status: Secondary | ICD-10-CM | POA: Diagnosis not present

## 2015-01-22 DIAGNOSIS — Z880 Allergy status to penicillin: Secondary | ICD-10-CM | POA: Insufficient documentation

## 2015-01-22 DIAGNOSIS — I129 Hypertensive chronic kidney disease with stage 1 through stage 4 chronic kidney disease, or unspecified chronic kidney disease: Secondary | ICD-10-CM | POA: Diagnosis not present

## 2015-01-22 DIAGNOSIS — Z7982 Long term (current) use of aspirin: Secondary | ICD-10-CM | POA: Insufficient documentation

## 2015-01-22 DIAGNOSIS — Z792 Long term (current) use of antibiotics: Secondary | ICD-10-CM | POA: Diagnosis not present

## 2015-01-22 DIAGNOSIS — Z96651 Presence of right artificial knee joint: Secondary | ICD-10-CM | POA: Insufficient documentation

## 2015-01-22 DIAGNOSIS — F419 Anxiety disorder, unspecified: Secondary | ICD-10-CM

## 2015-01-22 DIAGNOSIS — R0789 Other chest pain: Secondary | ICD-10-CM | POA: Diagnosis not present

## 2015-01-22 DIAGNOSIS — R079 Chest pain, unspecified: Secondary | ICD-10-CM | POA: Diagnosis present

## 2015-01-22 LAB — BASIC METABOLIC PANEL
ANION GAP: 9 (ref 5–15)
BUN: 11 mg/dL (ref 6–20)
CALCIUM: 9.5 mg/dL (ref 8.9–10.3)
CHLORIDE: 104 mmol/L (ref 101–111)
CO2: 24 mmol/L (ref 22–32)
CREATININE: 1.01 mg/dL (ref 0.61–1.24)
Glucose, Bld: 170 mg/dL — ABNORMAL HIGH (ref 65–99)
Potassium: 3.5 mmol/L (ref 3.5–5.1)
SODIUM: 137 mmol/L (ref 135–145)

## 2015-01-22 LAB — CBC
HCT: 43.4 % (ref 40.0–52.0)
Hemoglobin: 14.3 g/dL (ref 13.0–18.0)
MCH: 29.2 pg (ref 26.0–34.0)
MCHC: 32.9 g/dL (ref 32.0–36.0)
MCV: 88.7 fL (ref 80.0–100.0)
PLATELETS: 201 10*3/uL (ref 150–440)
RBC: 4.9 MIL/uL (ref 4.40–5.90)
RDW: 13.8 % (ref 11.5–14.5)
WBC: 6.7 10*3/uL (ref 3.8–10.6)

## 2015-01-22 LAB — TROPONIN I

## 2015-01-22 LAB — FIBRIN DERIVATIVES D-DIMER (ARMC ONLY): Fibrin derivatives D-dimer (ARMC): 2075 — ABNORMAL HIGH (ref 0–499)

## 2015-01-22 MED ORDER — LORAZEPAM 1 MG PO TABS
1.0000 mg | ORAL_TABLET | Freq: Once | ORAL | Status: AC
  Administered 2015-01-22: 1 mg via ORAL
  Filled 2015-01-22: qty 1

## 2015-01-22 MED ORDER — IOHEXOL 350 MG/ML SOLN
100.0000 mL | Freq: Once | INTRAVENOUS | Status: AC | PRN
  Administered 2015-01-22: 100 mL via INTRAVENOUS

## 2015-01-22 MED ORDER — LORAZEPAM 0.5 MG PO TABS: 0.5000 mg | ORAL_TABLET | Freq: Two times a day (BID) | ORAL | Status: AC

## 2015-01-22 NOTE — ED Provider Notes (Signed)
[facility] Emergency Department Provider Note  ____________________________________________   I have reviewed the triage vital signs and the nursing notes.   HISTORY  Chief Complaint Chest Pain    HPI Steven Madden is a 59 y.o. male presents today complaining of multiple different things. Patient states he is having a panic attack. He has had multiple panic attacks. He states he was diagnosed with panic disorder and age 83. He states he often has chest pain with it. Sometimes it radiates. He states that initially he felt this was not a panic attack. He waited in the waiting room he realized this was the same as a panic attack. In the event, patient has had gradual onset 24 hours worth of discomfort nonstop she describes as a twisting discomfort. He states that his Xanax is insufficient. He has had negative stress tests for this in the past. He has no SI or HI. Patient did have a recent knee replacement is very anxious about the fact that he needs to have his ankle were placed as well. He denies any fever or chills area the pain is nonexertional and it doesn't seem to vary depending on what he is doing.The patient states that sometimes the pain seems pleuritic and sometimes it doesn't. He has had a runny nose and cough.  Past Medical History  Diagnosis Date  . Anxiety     takes Zoloft  . Mental disorder     panic disorder . Rarely takes Xanax  . Arthritis     knees  . Chronic kidney disease     Rt UPJ stone  . Pneumonia     age 14 yr old  . Depression   . Hypertension     There are no active problems to display for this patient.   Past Surgical History  Procedure Laterality Date  . Shoulder arthroscopy distal clavicle excision and open rotator cuff repair  8 yrs ago    Right shoulder  . Wrist arthroscopy  23 yr ago  . Joint replacement      lt knee replacement x 2  . Total knee left  last done 8 yrs ago    x 2  . Knee arthroscopy  yrs ago     left knee  . Cystoscopy w/ ureteral stent placement  07/20/2011    Procedure: CYSTOSCOPY WITH RETROGRADE PYELOGRAM/URETERAL STENT PLACEMENT;  Surgeon: Kathi Ludwig, MD;  Location: [facility];  Service: Urology;  Laterality: Right;  . Right knee arthroscopy  dec 2012 last done    x 2    Current Outpatient Rx  Name  Route  Sig  Dispense  Refill  . ALPRAZolam (XANAX) 0.25 MG tablet   Oral   Take 1 tablet by mouth 2 (two) times daily as needed.      5   . aspirin EC 81 MG tablet   Oral   Take 81 mg by mouth daily.         . cephALEXin (KEFLEX) 500 MG capsule   Oral   Take 1 capsule (500 mg total) by mouth 3 (three) times daily.   21 capsule   0   . clotrimazole (LOTRIMIN) 1 % cream   Topical   Apply 1 application topically 2 (two) times daily.         Marland Kitchen HYDROcodone-acetaminophen (NORCO) 7.5-325 MG per tablet   Oral   Take 1 tablet by mouth every 6 (six) hours as needed. Pain         .  LISINOPRIL-HYDROCHLOROTHIAZIDE PO   Oral   Take 1 tablet by mouth daily.         . ondansetron (ZOFRAN) 4 MG tablet   Oral   Take 1 tablet (4 mg total) by mouth every 8 (eight) hours as needed for nausea or vomiting.   10 tablet   0   . sertraline (ZOLOFT) 50 MG tablet   Oral   Take 50 mg by mouth daily with breakfast.          . sulfamethoxazole-trimethoprim (BACTRIM DS) 800-160 MG tablet   Oral   Take 1 tablet by mouth 2 (two) times daily.   14 tablet   0   . tetrahydrozoline-zinc (VISINE-AC) 0.05-0.25 % ophthalmic solution   Both Eyes   Place 2 drops into both eyes as needed. Itchy eyes           Allergies Meloxicam; Pseudoephedrine hcl; Latex; and Penicillins  No family history on file.  Social History Social History  Substance Use Topics  . Smoking status: Never Smoker   . Smokeless tobacco: Never Used  . Alcohol Use: No     Comment: very rare    Review of Systems Constitutional: No fever/chills Eyes: No visual changes. ENT:  No sore throat. No stiff neck no neck pain Cardiovascular: See history of present illness Respiratory: Denies shortness of breath. Gastrointestinal:   no vomiting.  No diarrhea.  No constipation. Genitourinary: Negative for dysuria. Musculoskeletal: Negative lower extremity swelling Skin: Negative for rash. Neurological: Negative for headaches, focal weakness or numbness. 10-point ROS otherwise negative.  ____________________________________________   PHYSICAL EXAM:  VITAL SIGNS: ED Triage Vitals  Enc Vitals Group     BP 01/22/15 1924 162/95 mmHg     Pulse Rate 01/22/15 1606 84     Resp 01/22/15 1606 20     Temp 01/22/15 1606 97.9 F (36.6 C)     Temp Source 01/22/15 1606 Oral     SpO2 01/22/15 1606 97 %     Weight 01/22/15 1606 220 lb (99.791 kg)     Height 01/22/15 1606  (1.803 m)     Head Cir --      Peak Flow --      Pain Score 01/22/15 1606 5     Pain Loc --      Pain Edu? --      Excl. in GC? --     Constitutional: Alert and oriented. Well appearing and in no acute distress. Very anxious Eyes: Conjunctivae are normal. PERRL. EOMI. Head: Atraumatic. Nose: No congestion/rhinnorhea. Mouth/Throat: Mucous membranes are moist.  Oropharynx non-erythematous. Neck: No stridor.   Nontender with no meningismus Cardiovascular: Normal rate, regular rhythm. Grossly normal heart sounds.  Good peripheral circulation. Respiratory: Normal respiratory effort.  No retractions. Lungs CTAB. Abdominal: Soft and nontender. No distention. No guarding no rebound Back:  There is no focal tenderness or step off there is no midline tenderness there are no lesions noted. there is no CVA tenderness Musculoskeletal: No lower extremity tenderness. No joint effusions, no DVT signs strong distal pulses no edema, the area where his surgery was on the right knee is clean dry and intact with closed and well-healed suture line and no erythematous or evidence of infection not hot to  touch Neurologic:  Normal speech and language. No gross focal neurologic deficits are appreciated.  Skin:  Skin is warm, dry and intact. No rash noted. Psychiatric: Mood and affect are very anxious . Speech and behavior are normal.  ____________________________________________   LABS (all labs ordered are listed, but only abnormal results are displayed)  Labs Reviewed  BASIC METABOLIC PANEL - Abnormal; Notable for the following:    Glucose, Bld 170 (*)    All other components within normal limits  FIBRIN DERIVATIVES D-DIMER ([facility] ONLY) - Abnormal; Notable for the following:    Fibrin derivatives D-dimer [facility] 2075 (*)    All other components within normal limits  CBC  TROPONIN I  TROPONIN I   ____________________________________________  EKG  I personally interpreted any EKGs ordered by me or triage Sinus rhythm rate 89 bpm no acute ST elevation no acute ST depression, nonspecific ST changes no acute ischemia ____________________________________________  RADIOLOGY  I reviewed any imaging ordered by me or triage that were performed during my shift ____________________________________________   PROCEDURES  Procedure(s) performed: None  Critical Care performed: None  ____________________________________________   INITIAL IMPRESSION / ASSESSMENT AND PLAN / ED COURSE  Pertinent labs & imaging results that were available during my care of the patient were reviewed by me and considered in my medical decision making (see chart for details).  Patient presents complaining of anxiety. Also complaining of a cough, chest pain. Although the history is concerning patient states this is very consistent with multiple prior panic attacks he has had since he was 59 years old. This is despite what is noted in the triage chart. Nonetheless, I did do serial cardiac enzymes which are negative despite having had pain since yesterday. I also did a CT scan because of his recent surgery which  is negative for PE. There is no evidence of dissection. I do not believe that this represents ACS PE or dissection. The patient himself requested Ativan and ligated with him he states his symptoms completely went away. I will refer him to cardiology as a precaution but at this time, there is no evidence of acute ischemic pathology. Patient is very comfortable with this plan. He'll very much prefer to go home than be admitted. We have discussed return precautions. Patient is asking me for a prescription for Ativan because he feels it is stronger and better than his Xanax and I will give that to him but he understands he must not take them both at once and I can only give it to him for a day or 2. He'll follow closely with his doctor. ____________________________________________   FINAL CLINICAL IMPRESSION(S) / ED DIAGNOSES  Final diagnoses:  None     Jeanmarie PlantJames A McShane, MD 01/22/15 2316

## 2015-01-22 NOTE — ED Notes (Signed)

## 2015-01-22 NOTE — ED Notes (Signed)
Pt began last night with central chest pain, pain radiates into back and jaw. Pt reports he has been having chills and lightheadedness with pain. Pt also reports hx of anxiety but this does not feel similar.

## 2015-01-22 NOTE — ED Notes (Signed)
Pt has history of bilateral knee replacements. Pt states right knee was replaced two months ago. Pt states is scheduled to have right ankle replaced upon complete healing of right knee.

## 2016-01-14 ENCOUNTER — Emergency Department: Payer: Non-veteran care

## 2016-01-14 ENCOUNTER — Encounter: Payer: Self-pay | Admitting: *Deleted

## 2016-01-14 ENCOUNTER — Emergency Department
Admission: EM | Admit: 2016-01-14 | Discharge: 2016-01-14 | Disposition: A | Discharge status: Home / Self Care | Payer: Non-veteran care | Attending: Emergency Medicine | Admitting: Emergency Medicine

## 2016-01-14 DIAGNOSIS — R079 Chest pain, unspecified: Secondary | ICD-10-CM | POA: Diagnosis not present

## 2016-01-14 DIAGNOSIS — Z79899 Other long term (current) drug therapy: Secondary | ICD-10-CM | POA: Insufficient documentation

## 2016-01-14 DIAGNOSIS — N189 Chronic kidney disease, unspecified: Secondary | ICD-10-CM | POA: Insufficient documentation

## 2016-01-14 DIAGNOSIS — I129 Hypertensive chronic kidney disease with stage 1 through stage 4 chronic kidney disease, or unspecified chronic kidney disease: Secondary | ICD-10-CM | POA: Diagnosis not present

## 2016-01-14 DIAGNOSIS — I10 Essential (primary) hypertension: Secondary | ICD-10-CM

## 2016-01-14 LAB — CBC
HCT: 44.2 % (ref 40.0–52.0)
HEMOGLOBIN: 14.9 g/dL (ref 13.0–18.0)
MCH: 31 pg (ref 26.0–34.0)
MCHC: 33.7 g/dL (ref 32.0–36.0)
MCV: 92 fL (ref 80.0–100.0)
PLATELETS: 167 10*3/uL (ref 150–440)
RBC: 4.81 MIL/uL (ref 4.40–5.90)
RDW: 13.1 % (ref 11.5–14.5)
WBC: 5.5 10*3/uL (ref 3.8–10.6)

## 2016-01-14 LAB — BASIC METABOLIC PANEL
ANION GAP: 6 (ref 5–15)
BUN: 15 mg/dL (ref 6–20)
CALCIUM: 9.3 mg/dL (ref 8.9–10.3)
CO2: 26 mmol/L (ref 22–32)
CREATININE: 1.11 mg/dL (ref 0.61–1.24)
Chloride: 106 mmol/L (ref 101–111)
Glucose, Bld: 122 mg/dL — ABNORMAL HIGH (ref 65–99)
Potassium: 3.6 mmol/L (ref 3.5–5.1)
SODIUM: 138 mmol/L (ref 135–145)

## 2016-01-14 LAB — TROPONIN I: Troponin I: <0.03 ng/mL (ref <0.03)

## 2016-01-14 MED ORDER — ASPIRIN 81 MG PO CHEW
324.0000 mg | CHEWABLE_TABLET | Freq: Once | ORAL | Status: AC
  Administered 2016-01-14: 324 mg via ORAL
  Filled 2016-01-14: qty 4

## 2016-01-14 NOTE — ED Triage Notes (Signed)
Pt complains of a sudden onset of chest pain starting 30 minutes ago while pt was sitting on the edge of the bed, pt reports dizziness at times and just not feeling well

## 2016-01-14 NOTE — Discharge Instructions (Addendum)
You were seen for chest pain. Your workup today was reassuring. As I explained to you that does not mean that you do not have heart disease. You may need further evaluation to ensure you do not have a serious heart problem. Therefore it is imperative that you follow up with your doctor in 1-2 days for further evaluation.   Additionally, your blood pressure was significantly elevated today.  This likely was exacerbated by being in the emergency department, but we do recommend he follow up with your regular doctor at the next available opportunity.  You may need to start back on the blood pressure medicine that you took previously.  Your lab work was all reassuring with no evidence of what we call "end organ dysfunction" at this time.  When should you call for help?  Call 911 if: You passed out (lost consciousness) or if you feel dizzy. You have difficulty breathing. You have symptoms of a heart attack. These may include: Chest pain or pressure, or a strange feeling in your chest. Indigestion. Sweating. Shortness of breath. Nausea or vomiting. Pain, pressure, or a strange feeling in your back, neck, jaw, or upper belly or in one or both shoulders or arms. Lightheadedness or sudden weakness. A fast or irregular heartbeat. After you call 911, the operator may tell you to chew 1 adult-strength or 2 to 4 low-dose aspirin. Wait for an ambulance. Do not try to drive yourself.   Call your doctor today if: You have any trouble breathing. Your chest pain gets worse. You are dizzy or lightheaded, or you feel like you may faint. You are not getting better as expected. You are having new or different chest pain  How can you care for yourself at home? Rest until you feel better. Take your medicine exactly as prescribed. Call your doctor if you think you are having a problem with your medicine. Do not drive after taking a prescription pain medicine.

## 2016-01-14 NOTE — ED Notes (Signed)
Pt states sudden onset CP at home that has since resided. Pt states L shoulder pain that began when Dr. Don PerkingVeronese was assessing him, states worse with movement. States has injury L shoulder before. Pt is alert and oriented x 4, lying on stretcher, no apparent distress noted.

## 2016-01-14 NOTE — ED Provider Notes (Signed)
[facility] Emergency Department Provider Note  ____________________________________________  Time seen: Approximately 1:36 PM  I have reviewed the triage vital signs and the nursing notes.   HISTORY  Chief Complaint Chest Pain   HPI Steven Madden is a 60 y.o. male a history of anxiety who presents for evaluation of chest pain. Patient reports that he was sitting on the edge of his bed around 10 AM this morning when he developed chest pain. He describes the episode as chest pressure, diffuse across his chest and felt that both his arms got very heavy. The episode lasted about 20 seconds and resolved with no intervention. The pain did not radiate. He reports that he felt very anxious when the chest pain started and had mild dizziness and shortness of breath. He reports never having anything like this before and denies feeling anxious before this episode. He denies nausea, vomiting, diaphoresis during this episode. Patient reports that he is an avid biker and rides his bike every other day. He rode his bike yesterday for 20 miles with no chest pain or shortness of breath. He has had a stress test many years ago which was negative. His mother has had a heart attack in her 13s. He is a former smoker and quit when he was a teenager. He no longer cares a diagnose of hypertension after losing weight and does not take medications for it. Patient denies any chest pain at this time. Patient does endorse chronic neck pain and wonders if that episode was possibly coming from his neck radiating to his shoulder and chest.  Past Medical History:  Diagnosis Date  . Anxiety    takes Zoloft  . Arthritis    knees  . Chronic kidney disease    Rt UPJ stone  . Depression   . Hypertension   . Mental disorder    panic disorder . Rarely takes Xanax  . Pneumonia    age 65 yr old    There are no active problems to display for this patient.   Past Surgical History:  Procedure  Laterality Date  . CYSTOSCOPY W/ URETERAL STENT PLACEMENT  07/20/2011   Procedure: CYSTOSCOPY WITH RETROGRADE PYELOGRAM/URETERAL STENT PLACEMENT;  Surgeon: Kathi Ludwig, MD;  Location: [facility];  Service: Urology;  Laterality: Right;  . JOINT REPLACEMENT     lt knee replacement x 2  . KNEE ARTHROSCOPY  yrs ago   left knee  . right knee arthroscopy  dec 2012 last done   x 2  . SHOULDER ARTHROSCOPY DISTAL CLAVICLE EXCISION AND OPEN ROTATOR CUFF REPAIR  8 yrs ago   Right shoulder  . total knee left  last done 8 yrs ago   x 2  . WRIST ARTHROSCOPY  23 yr ago    Prior to Admission medications   Medication Sig Start Date End Date Taking? Authorizing Provider  acetaminophen (TYLENOL) 325 MG tablet Take 650 mg by mouth every 6 (six) hours as needed for mild pain.   Yes Historical Provider, MD  aspirin EC 81 MG tablet Take 81 mg by mouth daily.   Yes Historical Provider, MD  Cyanocobalamin (VITAMIN B 12 PO) Take 1 tablet by mouth daily.   Yes Historical Provider, MD  sertraline (ZOLOFT) 50 MG tablet Take 50 mg by mouth every 12 (twelve) hours.    Yes Historical Provider, MD  ALPRAZolam Prudy Feeler) 0.25 MG tablet Take 1 tablet by mouth 2 (two) times daily as needed. 10/25/14  Historical Provider, MD  LORazepam (ATIVAN) 0.5 MG tablet Take 1 tablet (0.5 mg total) by mouth 2 (two) times daily. Patient not taking: Reported on 01/14/2016 01/22/15 01/22/16  Jeanmarie PlantJames A McShane, MD    Allergies Meloxicam; Pseudoephedrine hcl; Latex; and Penicillins  No family history on file.  Social History Social History  Substance Use Topics  . Smoking status: Never Smoker  . Smokeless tobacco: Never Used  . Alcohol use No     Comment: very rare    Review of Systems  Constitutional: Negative for fever. Eyes: Negative for visual changes. ENT: Negative for sore throat. Neck: No neck pain  Cardiovascular: + chest pain. Respiratory: + shortness of breath. Gastrointestinal: Negative for  abdominal pain, vomiting or diarrhea. Genitourinary: Negative for dysuria. Musculoskeletal: Negative for back pain. Skin: Negative for rash. Neurological: Negative for headaches, weakness or numbness. Psych: No SI or HI  ____________________________________________   PHYSICAL EXAM:  VITAL SIGNS: ED Triage Vitals  Enc Vitals Group     BP 01/14/16 1319 (!) 168/96     Pulse Rate 01/14/16 1319 66     Resp 01/14/16 1319 11     Temp --      Temp src --      SpO2 01/14/16 1319 98 %     Weight 01/14/16 1122 220 lb (99.8 kg)     Height 01/14/16 1122 5\' 11"  (1.803 m)     Head Circumference --      Peak Flow --      Pain Score 01/14/16 1122 5     Pain Loc --      Pain Edu? --      Excl. in GC? --     Constitutional: Alert and oriented. Well appearing and in no apparent distress. HEENT:      Head: Normocephalic and atraumatic.         Eyes: Conjunctivae are normal. Sclera is non-icteric. EOMI. PERRL      Mouth/Throat: Mucous membranes are moist.       Neck: Supple with no signs of meningismus. Cardiovascular: Regular rate and rhythm. No murmurs, gallops, or rubs. 2+ symmetrical distal pulses are present in all extremities. No JVD. Respiratory: Normal respiratory effort. Lungs are clear to auscultation bilaterally. No wheezes, crackles, or rhonchi.  Gastrointestinal: Soft, non tender, and non distended with positive bowel sounds. No rebound or guarding. Genitourinary: No CVA tenderness. Musculoskeletal: Nontender with normal range of motion in all extremities. No edema, cyanosis, or erythema of extremities. Neurologic: Normal speech and language. Face is symmetric. Moving all extremities. No gross focal neurologic deficits are appreciated. Skin: Skin is warm, dry and intact. No rash noted. Psychiatric: Mood and affect are normal. Speech and behavior are normal.  ____________________________________________   LABS (all labs ordered are listed, but only abnormal results are  displayed)  Labs Reviewed  BASIC METABOLIC PANEL - Abnormal; Notable for the following:       Result Value   Glucose, Bld 122 (*)    All other components within normal limits  CBC  TROPONIN I  TROPONIN I   ____________________________________________  EKG  ED ECG REPORT I, Nita Sicklearolina Consetta Cosner, the attending physician, personally viewed and interpreted this ECG.  11:20 - normal sinus rhythm, rate of 93, normal intervals, normal axis, no ST elevations or depressions, T-wave inversions in lead 3.  13:36 - normal sinus rhythm, rate of 61, normal intervals, normal axis, no ST elevations or depressions, T-wave flattening in lead 3. ____________________________________________  RADIOLOGY  CXR:  Negative ____________________________________________   PROCEDURES  Procedure(s) performed: None Procedures Critical Care performed:  None ____________________________________________   INITIAL IMPRESSION / ASSESSMENT AND PLAN / ED COURSE   60 y.o. male a history of anxiety who presents for evaluation of chest pain.   Chest pain in a 60 y.o. male with low suspicion for cardiac (HEART score 1) or other serious etiology (including aortic dissection, pneumonia, pneumothorax, or pulmonary embolism) based his history and physical exam in the ED today. EKG normal. Plan for labs including CBC, chemistries and troponin now and 6 hours from episode of pain, CXR and re-evaluation for disposition. Will give full dose ASA . Will observe patient on cardiac monitor while in the ED and pain control.     Clinical Course    Troponin x 1 negative. Serial EKG negative for ischemia. No pain in the ED. 2nd troponin pending. Plan to discharge home if second troponin is negative with close follow-up with the primary care doctor. Care transferred to Dr. York CeriseForbach.  Pertinent labs & imaging results that were available during my care of the patient were reviewed by me and considered in my medical decision making  (see chart for details).    ____________________________________________   FINAL CLINICAL IMPRESSION(S) / ED DIAGNOSES  Final diagnoses:  Chest pain, unspecified type  Hypertension, unspecified type      NEW MEDICATIONS STARTED DURING THIS VISIT:  Discharge Medication List as of 01/14/2016  5:36 PM       Note:  This document was prepared using Dragon voice recognition software and may include unintentional dictation errors.    Nita Sicklearolina Danniel Grenz, MD 01/15/16 2215

## 2016-01-14 NOTE — ED Provider Notes (Signed)
-----------------------------------------   3:36 PM on 01/14/2016 -----------------------------------------   Blood pressure (!) 183/99, pulse 68, resp. rate 16, height 5\' 11"  (1.803 m), weight 99.8 kg, SpO2 98 %.  Assuming care from Dr. Don PerkingVeronese.  In short, Steven Madden is a 60 y.o. male with a chief complaint of Chest Pain .  Refer to the original H&P for additional details.  The current plan of care is to follow-up second troponin and discharge if negative.   ----------------------------------------- 5:36 PM on 01/14/2016 -----------------------------------------  The patient's second troponin was negative.  He is asymptomatic at this time.  His blood pressures been significantly elevated throughout his emergency department stay.  We discussed this and he states that he was formally on blood pressure medicine but was taken off by his regular doctor before his orthopedic surgeries.  Because his lab work is normal with no evidence of end organ dysfunction and his symptoms have resolved, I feel that it is in his best interest to follow up with his primary care doctor to discuss repeat blood pressure measurement and possibly starting back on his medication rather than me starting him on medication tonight in the emergency department.  I discussed all this with him and his wife.  I gave my usual and customary return precautions.  He understands and agrees with plan.   Loleta Roseory Mackayla Mullins, MD 01/14/16 [phone]

## 2017-07-07 IMAGING — CT CT ANGIO CHEST
1 of 2 series · 18 of 30 positions shown · IV contrast (APPLIED)
Comparison: Chest radiograph performed earlier today at [DATE] p.m.

CLINICAL DATA: Acute onset of central chest pain, radiating to the
back and jaw. Chills and lightheadedness. Initial encounter.

EXAM:
CT ANGIOGRAPHY CHEST WITH CONTRAST
TECHNIQUE: Multidetector CT imaging of the chest was performed using the
standard protocol during bolus administration of intravenous
contrast. Multiplanar CT image reconstructions and MIPs were
obtained to evaluate the vascular anatomy.
CONTRAST:  100mL OMNIPAQUE IOHEXOL 350 MG/ML SOLN

[Series 5: pe 1.0 thins · axial · 0.80mm/px · z∈[-349,-77]mm · 18 of 306 slices shown]
[im 17/306  lung]
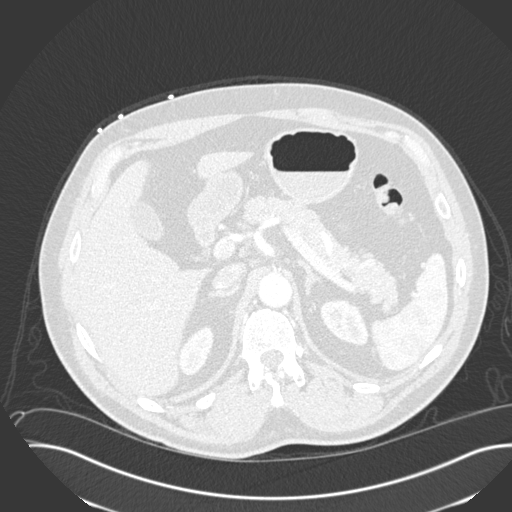
[im 34/306  mediastinal]
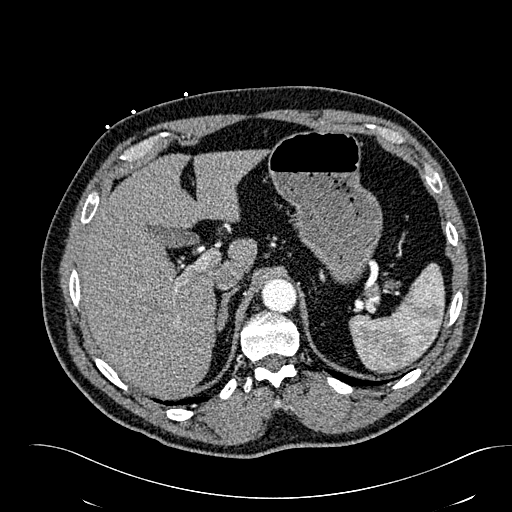
[im 51/306  lung]
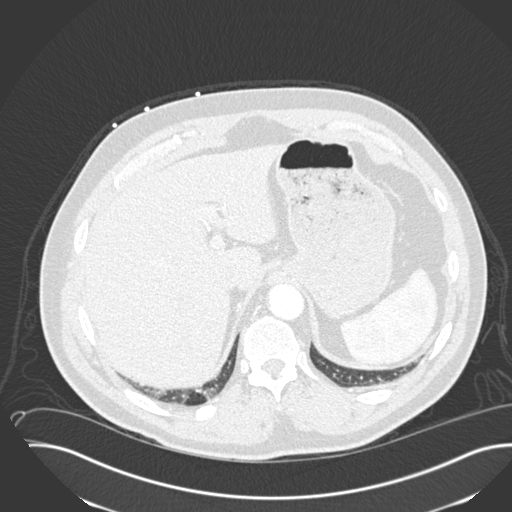
[im 68/306  mediastinal]
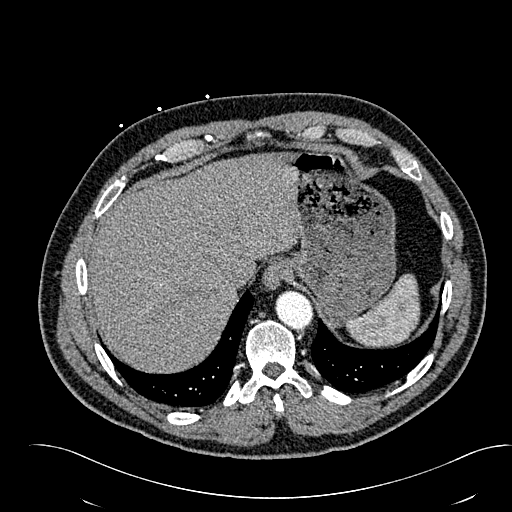
[im 85/306  lung]
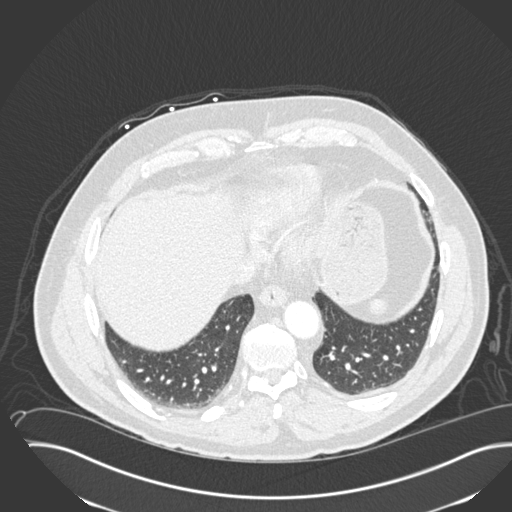
[im 102/306  mediastinal]
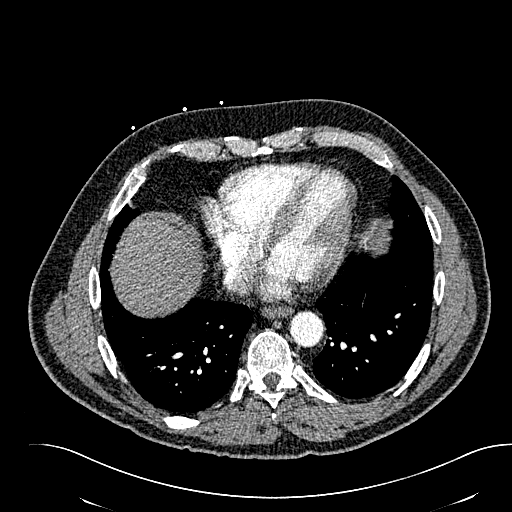
[im 119/306  lung]
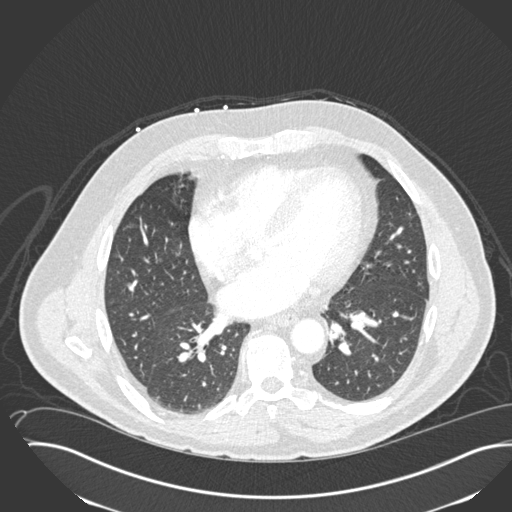
[im 136/306  mediastinal]
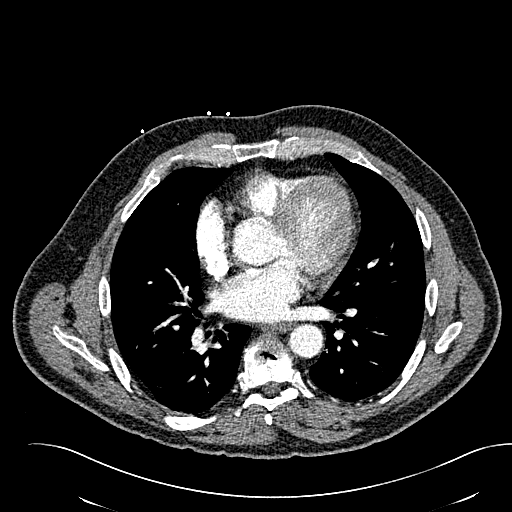
[im 144/306  lung]
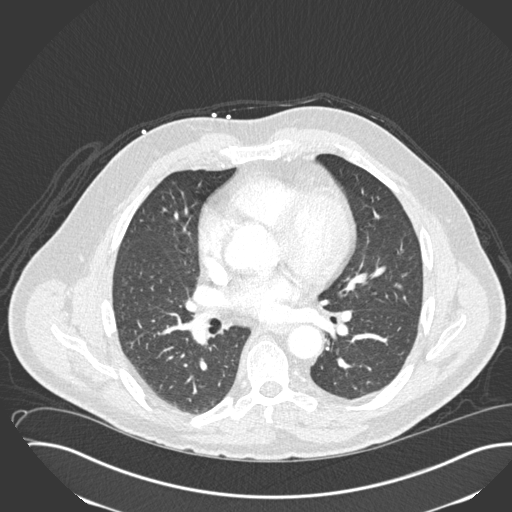
[im 153/306  mediastinal]
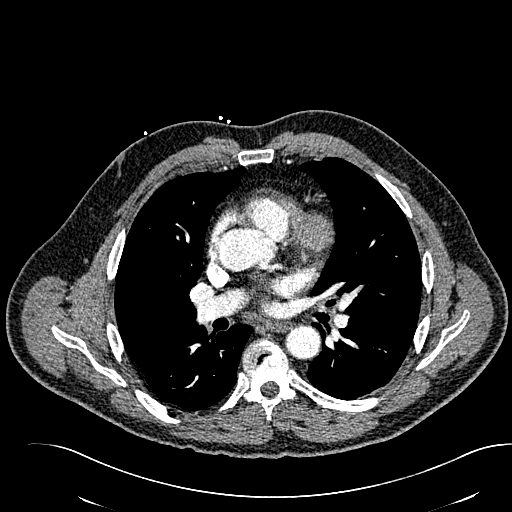
[im 170/306  lung]
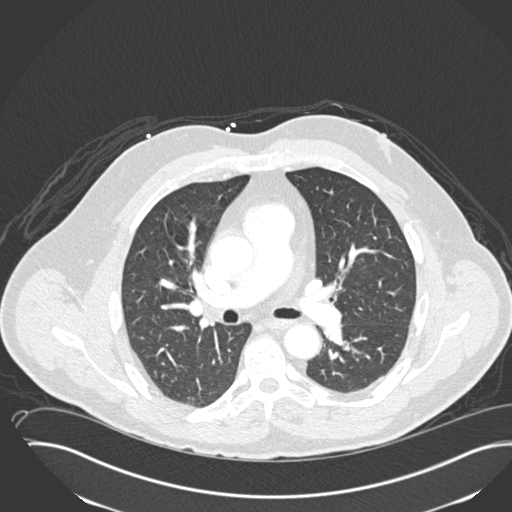
[im 187/306  mediastinal]
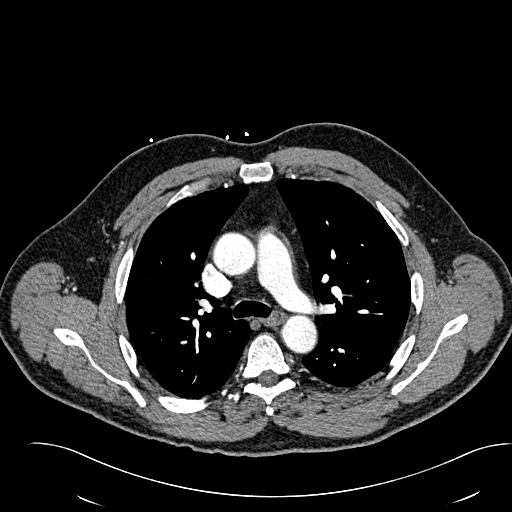
[im 204/306  lung]
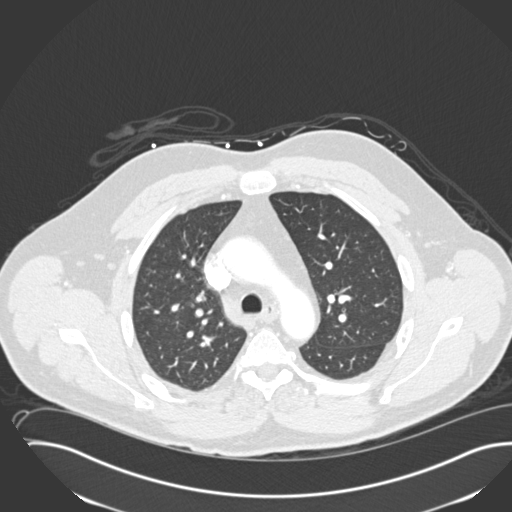
[im 221/306  mediastinal]
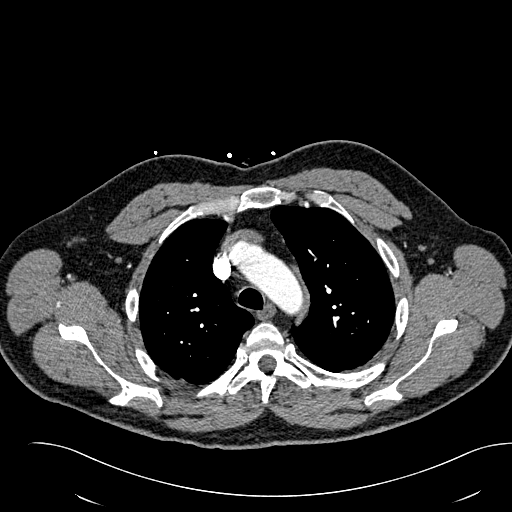
[im 238/306  lung]
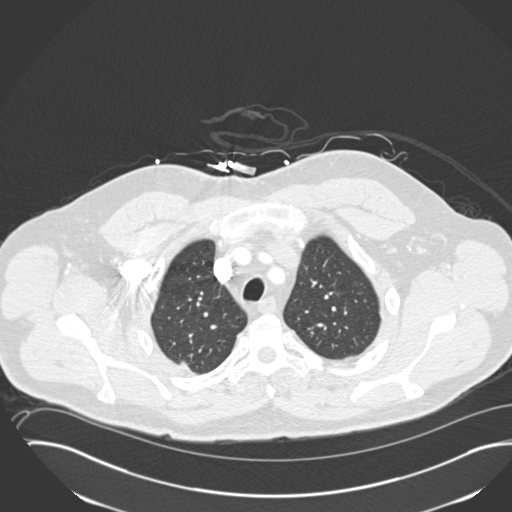
[im 255/306  mediastinal]
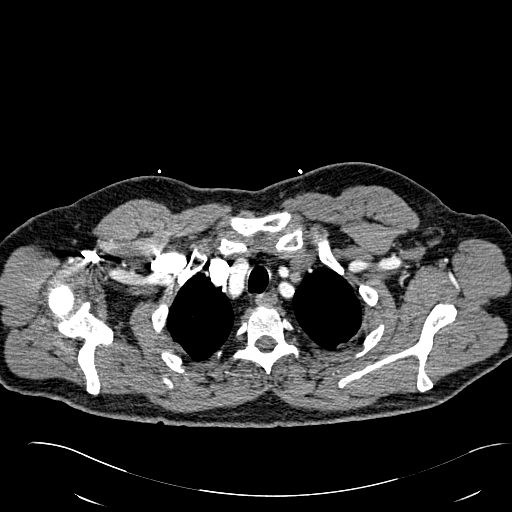
[im 272/306  lung]
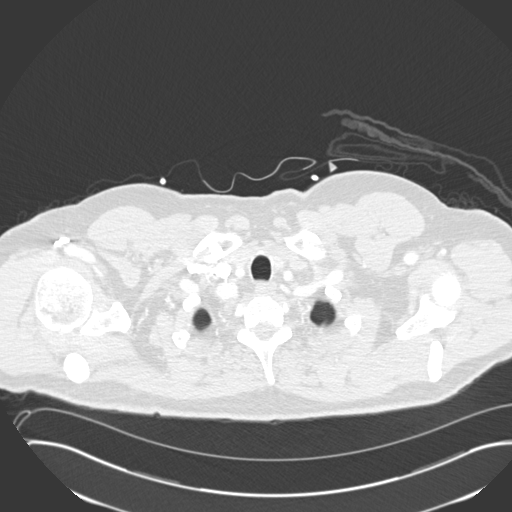
[im 289/306  mediastinal]
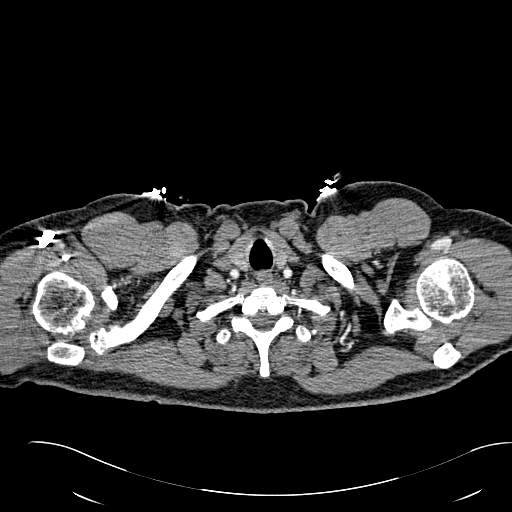

[18 of 30 positions shown; findings below may reference images not displayed]

FINDINGS: There is no evidence of pulmonary embolus.

Minimal right basilar atelectasis is noted. The lungs are otherwise
clear. There is no evidence of significant focal consolidation,
pleural effusion or pneumothorax. No masses are identified; no
abnormal focal contrast enhancement is seen.

The mediastinum is unremarkable in size. No mediastinal
lymphadenopathy is seen. No pericardial effusion is identified. The
great vessels are grossly unremarkable. No axillary lymphadenopathy
is seen. The visualized portions of the thyroid gland are
unremarkable in appearance.

The visualized portions of the liver and spleen are unremarkable.
The visualized portions of the pancreas, gallbladder, stomach,
adrenal glands and kidneys are within normal limits.

No acute osseous abnormalities are seen. There is mild chronic
compression deformity involving vertebral bodies T7, T8 and T9, with
associated vacuum phenomenon.

Review of the MIP images confirms the above findings.
IMPRESSION: 1. No evidence of pulmonary embolus.
2. Minimal right basilar atelectasis noted.  Lungs otherwise clear.
3. Mild chronic compression deformity involving vertebral bodies T7,
T8 and T9, with associated vacuum phenomenon.

## 2018-06-29 IMAGING — CR DG CHEST 2V
2 series · 2 of 2 positions shown · non-contrast
Comparison: 01/22/2015

CLINICAL DATA: Sudden onset of chest pain starting 30 minutes ago

EXAM:
CHEST  2 VIEW

[chest pa]
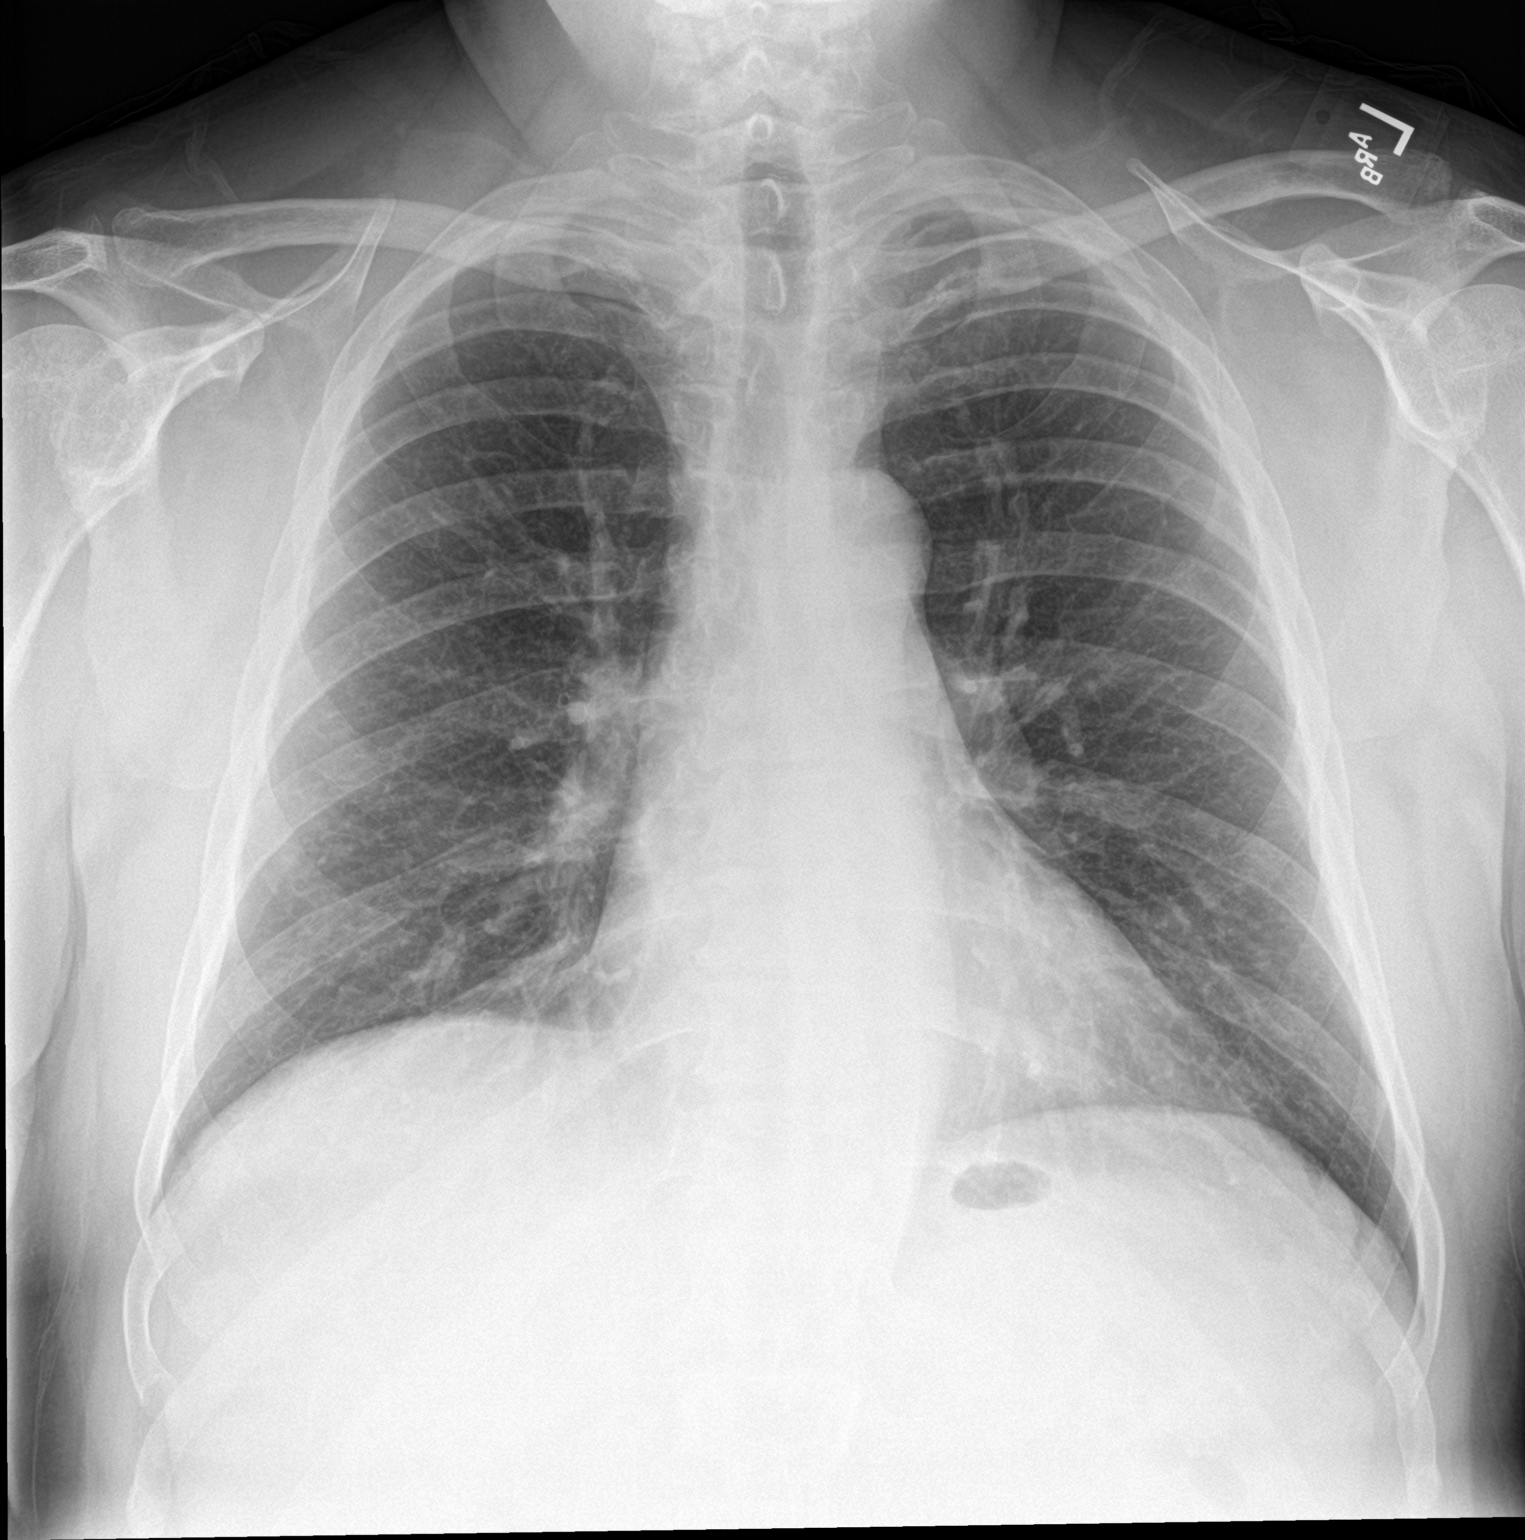

[chest lat]
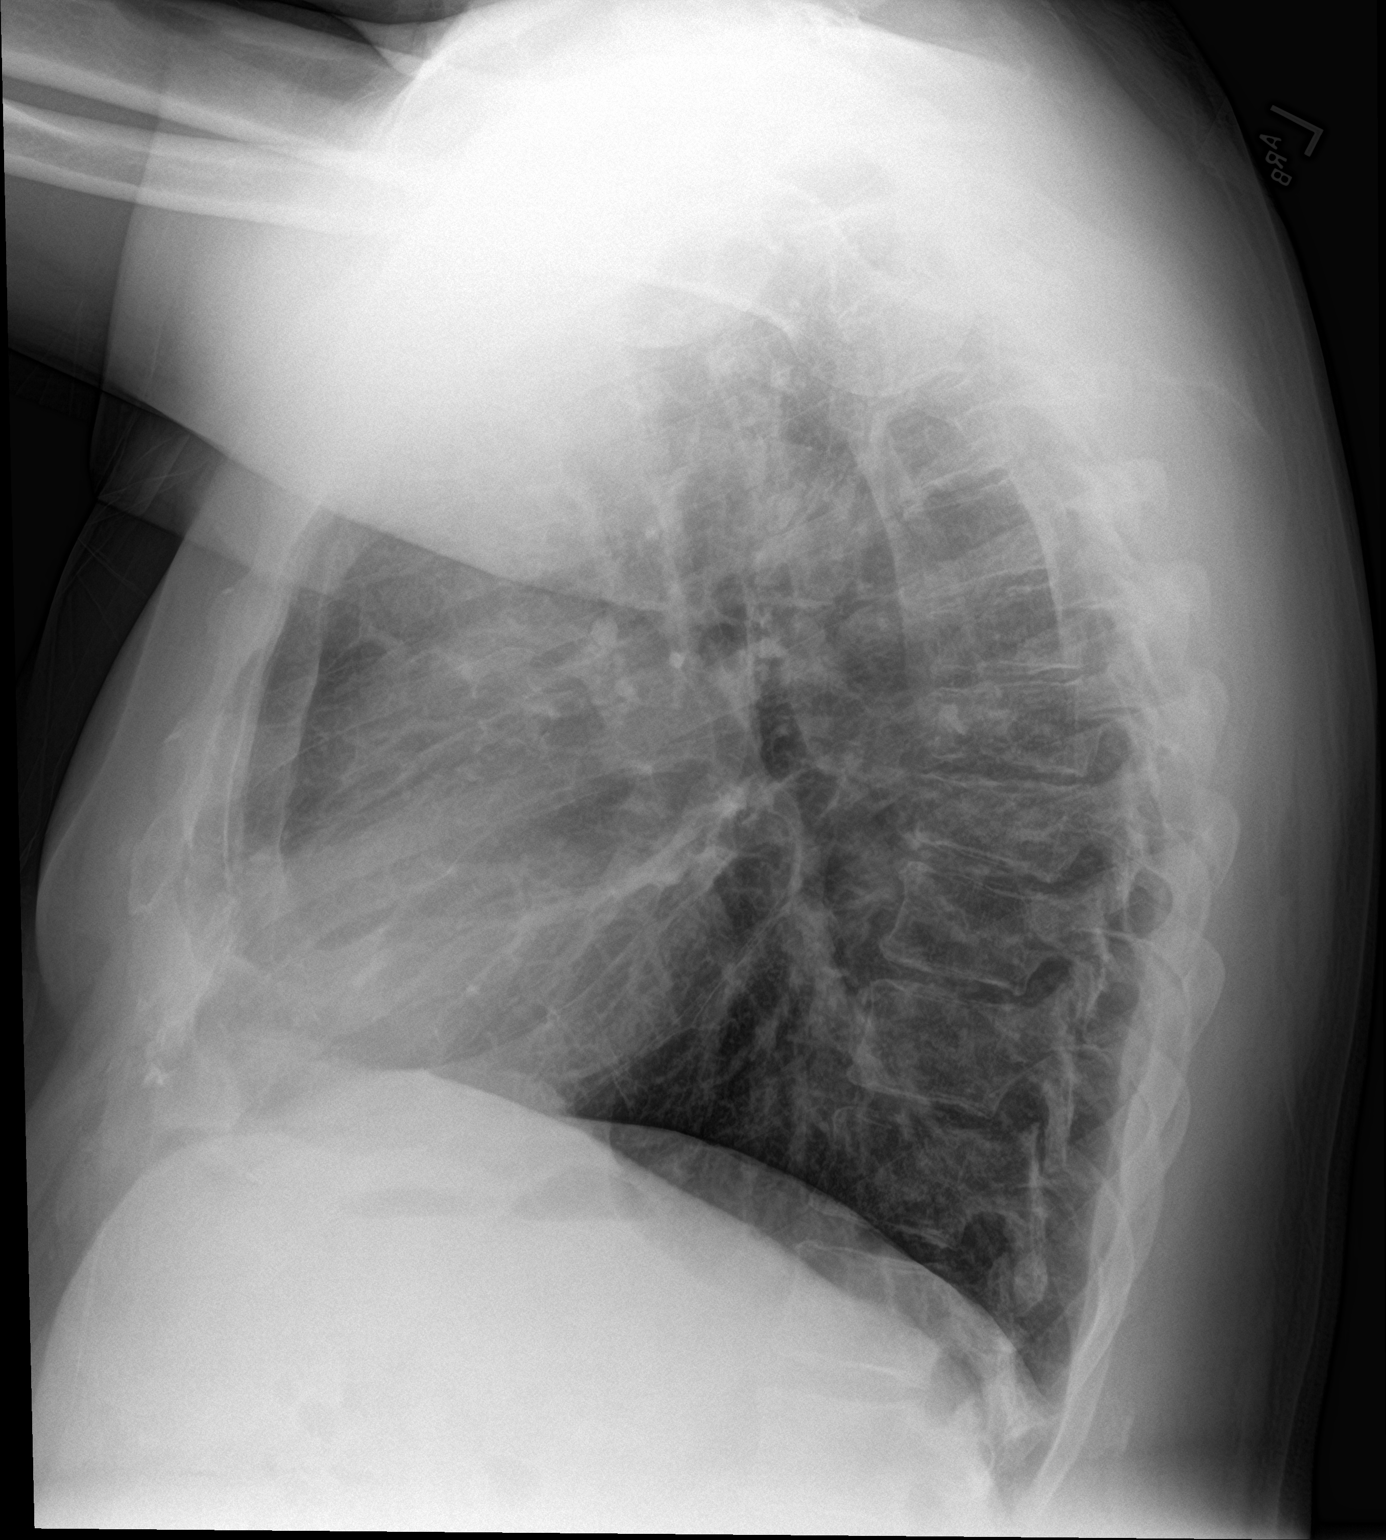

[2 of 2 positions shown; findings below may reference images not displayed]

FINDINGS: Cardiomediastinal silhouette is stable. No infiltrate or pleural
effusion. No pulmonary edema. Stable compression deformities mid
thoracic spine.
IMPRESSION: No active cardiopulmonary disease.
